# Patient Record
Sex: Female | Born: 1945 | Race: Black or African American | Marital: Married | Smoking: Never smoker
Health system: Southern US, Community
[De-identification: ages and names within clinical notes are randomized; demographics above are authoritative.]

## PROBLEM LIST (undated history)

## (undated) DIAGNOSIS — D219 Benign neoplasm of connective and other soft tissue, unspecified: Secondary | ICD-10-CM

## (undated) DIAGNOSIS — N950 Postmenopausal bleeding: Secondary | ICD-10-CM

## (undated) DIAGNOSIS — I10 Essential (primary) hypertension: Secondary | ICD-10-CM

## (undated) DIAGNOSIS — B3731 Acute candidiasis of vulva and vagina: Secondary | ICD-10-CM

## (undated) DIAGNOSIS — T7840XA Allergy, unspecified, initial encounter: Secondary | ICD-10-CM

## (undated) DIAGNOSIS — N951 Menopausal and female climacteric states: Secondary | ICD-10-CM

## (undated) DIAGNOSIS — N8189 Other female genital prolapse: Secondary | ICD-10-CM

## (undated) DIAGNOSIS — C50912 Malignant neoplasm of unspecified site of left female breast: Secondary | ICD-10-CM

## (undated) DIAGNOSIS — B373 Candidiasis of vulva and vagina: Secondary | ICD-10-CM

## (undated) DIAGNOSIS — E119 Type 2 diabetes mellitus without complications: Secondary | ICD-10-CM

## (undated) HISTORY — DX: Other female genital prolapse: N81.89

## (undated) HISTORY — DX: Allergy, unspecified, initial encounter: T78.40XA

## (undated) HISTORY — DX: Acute candidiasis of vulva and vagina: B37.31

## (undated) HISTORY — DX: Benign neoplasm of connective and other soft tissue, unspecified: D21.9

## (undated) HISTORY — DX: Malignant neoplasm of unspecified site of left female breast: C50.912

## (undated) HISTORY — DX: Candidiasis of vulva and vagina: B37.3

## (undated) HISTORY — DX: Menopausal and female climacteric states: N95.1

## (undated) HISTORY — DX: Type 2 diabetes mellitus without complications: E11.9

## (undated) HISTORY — PX: REDUCTION MAMMAPLASTY: SUR839

## (undated) HISTORY — DX: Postmenopausal bleeding: N95.0

## (undated) HISTORY — DX: Essential (primary) hypertension: I10

---

## 1984-03-23 DIAGNOSIS — C50912 Malignant neoplasm of unspecified site of left female breast: Secondary | ICD-10-CM

## 1984-03-23 HISTORY — DX: Malignant neoplasm of unspecified site of left female breast: C50.912

## 1984-03-23 HISTORY — PX: MASTECTOMY: SHX3

## 1998-03-15 ENCOUNTER — Other Ambulatory Visit: Admission: RE | Admit: 1998-03-15 | Discharge: 1998-03-15 | Payer: Self-pay | Admitting: Obstetrics and Gynecology

## 1999-03-19 ENCOUNTER — Other Ambulatory Visit: Admission: RE | Admit: 1999-03-19 | Discharge: 1999-03-19 | Payer: Self-pay | Admitting: Obstetrics and Gynecology

## 1999-04-30 ENCOUNTER — Encounter: Payer: Self-pay | Admitting: Family Medicine

## 1999-04-30 ENCOUNTER — Encounter: Admission: RE | Admit: 1999-04-30 | Discharge: 1999-04-30 | Payer: Self-pay | Admitting: Family Medicine

## 2000-04-07 ENCOUNTER — Other Ambulatory Visit: Admission: RE | Admit: 2000-04-07 | Discharge: 2000-04-07 | Payer: Self-pay | Admitting: Obstetrics and Gynecology

## 2000-04-07 DIAGNOSIS — N8189 Other female genital prolapse: Secondary | ICD-10-CM | POA: Insufficient documentation

## 2002-04-25 ENCOUNTER — Other Ambulatory Visit: Admission: RE | Admit: 2002-04-25 | Discharge: 2002-04-25 | Payer: Self-pay | Admitting: Obstetrics and Gynecology

## 2003-01-12 ENCOUNTER — Ambulatory Visit (HOSPITAL_COMMUNITY): Admission: RE | Admit: 2003-01-12 | Discharge: 2003-01-12 | Payer: Self-pay | Admitting: Gastroenterology

## 2003-05-07 ENCOUNTER — Other Ambulatory Visit: Admission: RE | Admit: 2003-05-07 | Discharge: 2003-05-07 | Payer: Self-pay | Admitting: Obstetrics and Gynecology

## 2004-05-01 ENCOUNTER — Encounter: Admission: RE | Admit: 2004-05-01 | Discharge: 2004-05-01 | Payer: Self-pay | Admitting: Family Medicine

## 2004-05-13 ENCOUNTER — Other Ambulatory Visit: Admission: RE | Admit: 2004-05-13 | Discharge: 2004-05-13 | Payer: Self-pay | Admitting: Obstetrics and Gynecology

## 2005-05-14 ENCOUNTER — Other Ambulatory Visit: Admission: RE | Admit: 2005-05-14 | Discharge: 2005-05-14 | Payer: Self-pay | Admitting: Obstetrics and Gynecology

## 2005-10-05 ENCOUNTER — Emergency Department (HOSPITAL_COMMUNITY): Admission: EM | Admit: 2005-10-05 | Discharge: 2005-10-05 | Payer: Self-pay | Admitting: Emergency Medicine

## 2010-06-17 DIAGNOSIS — R6882 Decreased libido: Secondary | ICD-10-CM | POA: Insufficient documentation

## 2011-05-13 DIAGNOSIS — R6882 Decreased libido: Secondary | ICD-10-CM

## 2011-05-13 DIAGNOSIS — C50912 Malignant neoplasm of unspecified site of left female breast: Secondary | ICD-10-CM

## 2011-05-13 DIAGNOSIS — N951 Menopausal and female climacteric states: Secondary | ICD-10-CM

## 2011-05-13 DIAGNOSIS — N8189 Other female genital prolapse: Secondary | ICD-10-CM

## 2011-06-09 DIAGNOSIS — Z1231 Encounter for screening mammogram for malignant neoplasm of breast: Secondary | ICD-10-CM | POA: Diagnosis not present

## 2011-06-23 ENCOUNTER — Ambulatory Visit: Payer: Self-pay | Admitting: Obstetrics and Gynecology

## 2011-08-04 ENCOUNTER — Ambulatory Visit (INDEPENDENT_AMBULATORY_CARE_PROVIDER_SITE_OTHER): Payer: Medicare Other | Admitting: Obstetrics and Gynecology

## 2011-08-04 ENCOUNTER — Encounter: Payer: Self-pay | Admitting: Obstetrics and Gynecology

## 2011-08-04 VITALS — BP 130/78 | Resp 16 | Ht 63.0 in | Wt 179.0 lb

## 2011-08-04 DIAGNOSIS — Z01419 Encounter for gynecological examination (general) (routine) without abnormal findings: Secondary | ICD-10-CM

## 2011-08-04 DIAGNOSIS — E669 Obesity, unspecified: Secondary | ICD-10-CM

## 2011-08-04 DIAGNOSIS — Z124 Encounter for screening for malignant neoplasm of cervix: Secondary | ICD-10-CM | POA: Diagnosis not present

## 2011-08-04 DIAGNOSIS — Z9189 Other specified personal risk factors, not elsewhere classified: Secondary | ICD-10-CM

## 2011-08-04 DIAGNOSIS — Z853 Personal history of malignant neoplasm of breast: Secondary | ICD-10-CM

## 2011-08-04 NOTE — Progress Notes (Signed)
Regular Periods: no Mammogram: yes  Monthly Breast Ex.: yes Exercise: yes  Tetanus < 10 years: yes Seatbelts: yes  NI. Bladder Functn.: yes Abuse at home: no  Daily BM's: yes Stressful Work: no  Healthy Diet: yes Sigmoid-Colonoscopy: 2005 "WNL"  Calcium: no Medical problems this year: per pt none   LAST PAP:05/2010 "WNL"  Contraception: per pt none  Mammogram:  05/2011 "WNL"   PCP: Dr. Earl Gala  PMH: No Changes  Irreg Periods: no Mood Swings: no Hot Flashes: yes Vaginal Dryness: no Poor Sleeping: yes Urinary Urgency: no UTI Symptoms: no HRT: no Fam Hx Osteo: no Prior Bone Scan: Yes 2003 Osteoporosis: No Hx of Dvt: No  FMH: No Changes  Last Bone Scan: " 2003"  Was last bone scan .pt is schedule for bone scan next month. 09-01-2011   Subjective:    JAMIRACLE AVANTS is a 66 y.o. female No obstetric history on file. who presents for annual exam. The patient has no complaints today. The patient has a history of breast cancer from 20 years ago.  She is currently without evidence of disease.  She continues to have some difficulty sleeping.  The following portions of the patient's history were reviewed and updated as appropriate: allergies, current medications, past family history, past medical history, past social history, past surgical history and problem list. See above.  Review of Systems Pertinent items are noted in HPI. Gastrointestinal:No change in bowel habits, no abdominal pain, no rectal bleeding Genitourinary:negative for dysuria, frequency, hematuria, nocturia and urinary incontinence    Objective:     BP 130/78  Resp 16  Ht 5\' 3"  (1.6 m)  Wt 179 lb (81.194 kg)  BMI 31.71 kg/m2  Weight:  Wt Readings from Last 1 Encounters:  08/04/11 179 lb (81.194 kg)     BMI: Body mass index is 31.71 kg/(m^2). General Appearance: Alert, appropriate appearance for age. No acute distress HEENT: Grossly normal Neck / Thyroid: Supple, no masses, nodes or enlargement Lungs:  clear to auscultation bilaterally Back: No CVA tenderness Breast Exam: scars from her mastectomy on the left and No masses or nodes.No dimpling, nipple retraction or discharge. Cardiovascular: Regular rate and rhythm. S1, S2, no murmur Gastrointestinal: Soft, non-tender, no masses or organomegaly  ++++++++++++++++++++++++++++++++++++++++++++++++++++++++  Pelvic Exam: External genitalia: normal general appearance Vaginal: normal without tenderness, induration or masses, atrophic mucosa and relaxation noted Cervix: normal appearance Adnexa: normal bimanual exam Uterus: normal size shape and consistency Rectovaginal: normal rectal, no masses  ++++++++++++++++++++++++++++++++++++++++++++++++++++++++  Lymphatic Exam: Non-palpable nodes in neck, clavicular, axillary, or inguinal regions  Psychiatric: Alert and oriented, appropriate affect.    Urinalysis:Not done      Assessment:    Normal gyn exam   Overweight or obese: Yes  Pelvic relaxation: Yes  Menopausal symptoms: Yes. Severe: No.   Plan:    Pap smear.   Follow-up:  for annual exam  STD screen request: none,  RPR: No,  HBsAg: No.  Hepatitis C: No.  The updated Pap smear screening guidelines were discussed with the patient. The patient requested that I obtain a Pap smear: Yes.  Kegel exercises discussed: Yes.  Proper diet and regular exercise were reviewed.  Annual mammograms recommended starting at age 63. Proper breast care was discussed.  Screening colonoscopy is recommended beginning at age 54.  Regular health maintenance was reviewed.  Sleep hygiene was discussed.  Adequate calcium and vitamin D intake was emphasized. The patient says that calcium does not agree with her.  Ana Coffey.D.

## 2011-08-07 LAB — PAP IG W/ RFLX HPV ASCU

## 2011-09-01 DIAGNOSIS — I10 Essential (primary) hypertension: Secondary | ICD-10-CM | POA: Diagnosis not present

## 2011-09-01 DIAGNOSIS — Z13820 Encounter for screening for osteoporosis: Secondary | ICD-10-CM | POA: Diagnosis not present

## 2011-09-22 DIAGNOSIS — M545 Low back pain: Secondary | ICD-10-CM | POA: Diagnosis not present

## 2012-03-01 DIAGNOSIS — I10 Essential (primary) hypertension: Secondary | ICD-10-CM | POA: Diagnosis not present

## 2012-03-01 DIAGNOSIS — Z1331 Encounter for screening for depression: Secondary | ICD-10-CM | POA: Diagnosis not present

## 2012-03-01 DIAGNOSIS — Z0000 Encounter for general adult medical examination without abnormal findings: Secondary | ICD-10-CM | POA: Diagnosis not present

## 2012-03-01 DIAGNOSIS — E7800 Pure hypercholesterolemia, unspecified: Secondary | ICD-10-CM | POA: Diagnosis not present

## 2012-04-01 DIAGNOSIS — L708 Other acne: Secondary | ICD-10-CM | POA: Diagnosis not present

## 2012-06-10 DIAGNOSIS — Z1231 Encounter for screening mammogram for malignant neoplasm of breast: Secondary | ICD-10-CM | POA: Diagnosis not present

## 2012-09-06 DIAGNOSIS — I10 Essential (primary) hypertension: Secondary | ICD-10-CM | POA: Diagnosis not present

## 2013-03-02 DIAGNOSIS — C50919 Malignant neoplasm of unspecified site of unspecified female breast: Secondary | ICD-10-CM | POA: Diagnosis not present

## 2013-03-02 DIAGNOSIS — Z124 Encounter for screening for malignant neoplasm of cervix: Secondary | ICD-10-CM | POA: Diagnosis not present

## 2013-03-02 DIAGNOSIS — Z01419 Encounter for gynecological examination (general) (routine) without abnormal findings: Secondary | ICD-10-CM | POA: Diagnosis not present

## 2013-03-02 DIAGNOSIS — F520 Hypoactive sexual desire disorder: Secondary | ICD-10-CM | POA: Diagnosis not present

## 2013-03-09 DIAGNOSIS — Z1331 Encounter for screening for depression: Secondary | ICD-10-CM | POA: Diagnosis not present

## 2013-03-09 DIAGNOSIS — E7800 Pure hypercholesterolemia, unspecified: Secondary | ICD-10-CM | POA: Diagnosis not present

## 2013-03-09 DIAGNOSIS — Z23 Encounter for immunization: Secondary | ICD-10-CM | POA: Diagnosis not present

## 2013-03-09 DIAGNOSIS — I10 Essential (primary) hypertension: Secondary | ICD-10-CM | POA: Diagnosis not present

## 2013-03-09 DIAGNOSIS — Z0000 Encounter for general adult medical examination without abnormal findings: Secondary | ICD-10-CM | POA: Diagnosis not present

## 2013-03-09 DIAGNOSIS — C50919 Malignant neoplasm of unspecified site of unspecified female breast: Secondary | ICD-10-CM | POA: Diagnosis not present

## 2013-06-09 DIAGNOSIS — D126 Benign neoplasm of colon, unspecified: Secondary | ICD-10-CM | POA: Diagnosis not present

## 2013-06-09 DIAGNOSIS — K5730 Diverticulosis of large intestine without perforation or abscess without bleeding: Secondary | ICD-10-CM | POA: Diagnosis not present

## 2013-06-09 DIAGNOSIS — Z1211 Encounter for screening for malignant neoplasm of colon: Secondary | ICD-10-CM | POA: Diagnosis not present

## 2013-06-09 DIAGNOSIS — K648 Other hemorrhoids: Secondary | ICD-10-CM | POA: Diagnosis not present

## 2013-06-09 DIAGNOSIS — K620 Anal polyp: Secondary | ICD-10-CM | POA: Diagnosis not present

## 2013-06-16 DIAGNOSIS — Z1231 Encounter for screening mammogram for malignant neoplasm of breast: Secondary | ICD-10-CM | POA: Diagnosis not present

## 2013-06-16 DIAGNOSIS — Z853 Personal history of malignant neoplasm of breast: Secondary | ICD-10-CM | POA: Diagnosis not present

## 2013-07-06 DIAGNOSIS — J309 Allergic rhinitis, unspecified: Secondary | ICD-10-CM | POA: Diagnosis not present

## 2013-09-07 DIAGNOSIS — I10 Essential (primary) hypertension: Secondary | ICD-10-CM | POA: Diagnosis not present

## 2013-09-07 DIAGNOSIS — M79609 Pain in unspecified limb: Secondary | ICD-10-CM | POA: Diagnosis not present

## 2014-03-13 DIAGNOSIS — L700 Acne vulgaris: Secondary | ICD-10-CM | POA: Diagnosis not present

## 2014-03-13 DIAGNOSIS — Z0000 Encounter for general adult medical examination without abnormal findings: Secondary | ICD-10-CM | POA: Diagnosis not present

## 2014-03-13 DIAGNOSIS — Z1389 Encounter for screening for other disorder: Secondary | ICD-10-CM | POA: Diagnosis not present

## 2014-03-13 DIAGNOSIS — Z23 Encounter for immunization: Secondary | ICD-10-CM | POA: Diagnosis not present

## 2014-03-13 DIAGNOSIS — I10 Essential (primary) hypertension: Secondary | ICD-10-CM | POA: Diagnosis not present

## 2014-03-13 DIAGNOSIS — E780 Pure hypercholesterolemia: Secondary | ICD-10-CM | POA: Diagnosis not present

## 2014-06-21 DIAGNOSIS — Z1231 Encounter for screening mammogram for malignant neoplasm of breast: Secondary | ICD-10-CM | POA: Diagnosis not present

## 2014-06-21 DIAGNOSIS — Z853 Personal history of malignant neoplasm of breast: Secondary | ICD-10-CM | POA: Diagnosis not present

## 2014-09-10 DIAGNOSIS — L680 Hirsutism: Secondary | ICD-10-CM | POA: Insufficient documentation

## 2014-09-10 DIAGNOSIS — L700 Acne vulgaris: Secondary | ICD-10-CM | POA: Diagnosis not present

## 2014-09-13 DIAGNOSIS — E780 Pure hypercholesterolemia: Secondary | ICD-10-CM | POA: Diagnosis not present

## 2014-09-13 DIAGNOSIS — L709 Acne, unspecified: Secondary | ICD-10-CM | POA: Diagnosis not present

## 2014-09-13 DIAGNOSIS — I10 Essential (primary) hypertension: Secondary | ICD-10-CM | POA: Diagnosis not present

## 2014-09-13 DIAGNOSIS — C50912 Malignant neoplasm of unspecified site of left female breast: Secondary | ICD-10-CM | POA: Diagnosis not present

## 2014-09-13 DIAGNOSIS — R232 Flushing: Secondary | ICD-10-CM | POA: Diagnosis not present

## 2015-03-26 DIAGNOSIS — N951 Menopausal and female climacteric states: Secondary | ICD-10-CM | POA: Diagnosis not present

## 2015-03-26 DIAGNOSIS — C50919 Malignant neoplasm of unspecified site of unspecified female breast: Secondary | ICD-10-CM | POA: Diagnosis not present

## 2015-03-26 DIAGNOSIS — Z124 Encounter for screening for malignant neoplasm of cervix: Secondary | ICD-10-CM | POA: Diagnosis not present

## 2015-03-26 DIAGNOSIS — Z01411 Encounter for gynecological examination (general) (routine) with abnormal findings: Secondary | ICD-10-CM | POA: Diagnosis not present

## 2015-03-26 DIAGNOSIS — Z6832 Body mass index (BMI) 32.0-32.9, adult: Secondary | ICD-10-CM | POA: Diagnosis not present

## 2015-03-26 DIAGNOSIS — Z01419 Encounter for gynecological examination (general) (routine) without abnormal findings: Secondary | ICD-10-CM | POA: Insufficient documentation

## 2015-04-15 DIAGNOSIS — C50912 Malignant neoplasm of unspecified site of left female breast: Secondary | ICD-10-CM | POA: Diagnosis not present

## 2015-04-15 DIAGNOSIS — Z1389 Encounter for screening for other disorder: Secondary | ICD-10-CM | POA: Diagnosis not present

## 2015-04-15 DIAGNOSIS — E7800 Pure hypercholesterolemia, unspecified: Secondary | ICD-10-CM | POA: Diagnosis not present

## 2015-04-15 DIAGNOSIS — R7309 Other abnormal glucose: Secondary | ICD-10-CM | POA: Diagnosis not present

## 2015-04-15 DIAGNOSIS — I10 Essential (primary) hypertension: Secondary | ICD-10-CM | POA: Diagnosis not present

## 2015-04-15 DIAGNOSIS — J069 Acute upper respiratory infection, unspecified: Secondary | ICD-10-CM | POA: Diagnosis not present

## 2015-04-15 DIAGNOSIS — Z780 Asymptomatic menopausal state: Secondary | ICD-10-CM | POA: Diagnosis not present

## 2015-04-15 DIAGNOSIS — R232 Flushing: Secondary | ICD-10-CM | POA: Diagnosis not present

## 2015-04-15 DIAGNOSIS — Z0000 Encounter for general adult medical examination without abnormal findings: Secondary | ICD-10-CM | POA: Diagnosis not present

## 2015-05-03 DIAGNOSIS — E119 Type 2 diabetes mellitus without complications: Secondary | ICD-10-CM | POA: Diagnosis not present

## 2015-05-03 DIAGNOSIS — Z7984 Long term (current) use of oral hypoglycemic drugs: Secondary | ICD-10-CM | POA: Diagnosis not present

## 2015-06-04 ENCOUNTER — Encounter: Payer: Self-pay | Admitting: *Deleted

## 2015-06-04 ENCOUNTER — Encounter: Payer: Medicare Other | Attending: Internal Medicine | Admitting: *Deleted

## 2015-06-04 VITALS — Ht 63.5 in | Wt 179.1 lb

## 2015-06-04 DIAGNOSIS — E119 Type 2 diabetes mellitus without complications: Secondary | ICD-10-CM

## 2015-06-04 NOTE — Patient Instructions (Addendum)
Plan:  Aim for 2-3 Carb Choices per meal (30-45 grams) +/- 1 either way  Aim for 0-15 Carbs per snack if hungry  Include protein in moderation with your meals and snacks Consider reading food labels for Total Carbohydrate and Fat Grams of foods Consider  increasing your activity level by exxercise for 30 minutes daily as tolerated Continue taking medication as directed by MD - Continue with your balanced meals including: protein, vegetable and carbohydrate Oatmeal + oiled egg = a win! Lunch: Salad with no fruit + smoothie = Win!  Start walking or exercising to a goal of 30 minutes 5 times per week. Make it a gradual process.  Baby Steps  You are doing a fabulous job with your food choices  Keep up the good work!

## 2015-06-04 NOTE — Progress Notes (Signed)
Diabetes Self-Management Education  Visit Type: First/Initial  Appt. Start Time: 1400 Appt. End Time: [id number]  06/04/2015  Ms. Ana Coffey, identified by name and date of birth, is a 70 y.o. female with a diagnosis of Diabetes: Type 2. Ana Coffey is married, both she and her husband are retired. She remains active with her church. She has "Silver Sneakers", home fitness equipments none of which she is actively utilizing. She does do some walking.  ASSESSMENT  Height 5' 3.5" (1.613 m), weight 179 lb 1.6 oz (81.239 kg). Body mass index is 31.22 kg/(m^2).      Diabetes Self-Management Education - 06/04/15 1427    Visit Information   Visit Type First/Initial   Initial Visit   Diabetes Type Type 2   Are you currently following a meal plan? No   Are you taking your medications as prescribed? Yes   Date Diagnosed 04/2015   Health Coping   How would you rate your overall health? Good   Psychosocial Assessment   Patient Belief/Attitude about Diabetes Motivated to manage diabetes   Self-care barriers None   Self-management support Doctor's office;CDE visits;Family   Other persons present Patient   Patient Concerns Nutrition/Meal planning;Healthy Lifestyle   Special Needs None   Preferred Learning Style No preference indicated   Learning Readiness Change in progress   How often do you need to have someone help you when you read instructions, pamphlets, or other written materials from your doctor or pharmacy? 2 - Rarely   What is the last grade level you completed in school? 2 yrs college   Complications   Last HgB A1C per patient/outside source 7.1 %   How often do you check your blood sugar? Not recommended by provider   Have you had a dilated eye exam in the past 12 months? No   Have you had a dental exam in the past 12 months? No   Are you checking your feet? No   Dietary Intake   Breakfast egg omlet, mixed fruit, vegetables   Lunch salad with chicken/salmon, fruit, nuts / [place]  dressing / raspberry vinegarette   /  vanilla greek yogurt, frozen fruit, coconut [place] milk, stevia   Dinner protein, vegetables, brown rice, red potatoes   Snack (evening) Caramel / white chedder popcorn / almonds, cashews   Beverage(s) water/flavored, coffee with flavored creamer, stevia   Exercise   Exercise Type ADL's   Patient Education   Previous Diabetes Education No   Disease state  Definition of diabetes, type 1 and 2, and the diagnosis of diabetes;Factors that contribute to the development of diabetes   Nutrition management  Role of diet in the treatment of diabetes and the relationship between the three main macronutrients and blood glucose level;Food label reading, portion sizes and measuring food.;Carbohydrate counting;Meal options for control of blood glucose level and chronic complications.   Physical activity and exercise  Role of exercise on diabetes management, blood pressure control and cardiac health.   Medications Reviewed patients medication for diabetes, action, purpose, timing of dose and side effects.   Monitoring Identified appropriate SMBG and/or A1C goals.   Chronic complications Relationship between chronic complications and blood glucose control;Retinopathy and reason for yearly dilated eye exams   Psychosocial adjustment Role of stress on diabetes   Individualized Goals (developed by patient)   Nutrition General guidelines for healthy choices and portions discussed   Physical Activity --  working on getting started   Medications take my medication as prescribed   Reducing  Risk increase portions of olive oil in diet;increase portions of nuts and seeds   Outcomes   Expected Outcomes Demonstrated interest in learning. Expect positive outcomes   Future DMSE PRN   Program Status Completed      Individualized Plan for Diabetes Self-Management Training:   Learning Objective:  Patient will have a greater understanding of diabetes self-management. Patient  education plan is to attend individual and/or group sessions per assessed needs and concerns.   Plan:   Patient Instructions  Plan:  Aim for 2-3 Carb Choices per meal (30-45 grams) +/- 1 either way  Aim for 0-15 Carbs per snack if hungry  Include protein in moderation with your meals and snacks Consider reading food labels for Total Carbohydrate and Fat Grams of foods Consider  increasing your activity level by exxercise for 30 minutes daily as tolerated Continue taking medication as directed by MD - Continue with your balanced meals including: protein, vegetable and carbohydrate Oatmeal + oiled egg = a win! Lunch: Salad with no fruit + smoothie = Win!  Start walking or exercising to a goal of 30 minutes 5 times per week. Make it a gradual process.  Baby Steps  You are doing a fabulous job with your food choices  Keep up the good work!  Expected Outcomes:  Demonstrated interest in learning. Expect positive outcomes  Education material provided: Living Well with Diabetes, A1C conversion sheet and Meal plan card  If problems or questions, patient to contact team via:  Phone  Future DSME appointment: PRN

## 2015-06-28 DIAGNOSIS — Z853 Personal history of malignant neoplasm of breast: Secondary | ICD-10-CM | POA: Diagnosis not present

## 2015-06-28 DIAGNOSIS — Z1231 Encounter for screening mammogram for malignant neoplasm of breast: Secondary | ICD-10-CM | POA: Diagnosis not present

## 2015-08-01 DIAGNOSIS — Z780 Asymptomatic menopausal state: Secondary | ICD-10-CM | POA: Diagnosis not present

## 2015-11-06 DIAGNOSIS — I10 Essential (primary) hypertension: Secondary | ICD-10-CM | POA: Diagnosis not present

## 2015-11-06 DIAGNOSIS — Z1159 Encounter for screening for other viral diseases: Secondary | ICD-10-CM | POA: Diagnosis not present

## 2015-11-06 DIAGNOSIS — C50912 Malignant neoplasm of unspecified site of left female breast: Secondary | ICD-10-CM | POA: Diagnosis not present

## 2015-11-06 DIAGNOSIS — E119 Type 2 diabetes mellitus without complications: Secondary | ICD-10-CM | POA: Diagnosis not present

## 2016-02-18 DIAGNOSIS — Z7984 Long term (current) use of oral hypoglycemic drugs: Secondary | ICD-10-CM | POA: Diagnosis not present

## 2016-02-18 DIAGNOSIS — E113291 Type 2 diabetes mellitus with mild nonproliferative diabetic retinopathy without macular edema, right eye: Secondary | ICD-10-CM | POA: Diagnosis not present

## 2016-02-18 DIAGNOSIS — I10 Essential (primary) hypertension: Secondary | ICD-10-CM | POA: Diagnosis not present

## 2016-02-18 DIAGNOSIS — H35032 Hypertensive retinopathy, left eye: Secondary | ICD-10-CM | POA: Diagnosis not present

## 2016-02-20 DIAGNOSIS — Z23 Encounter for immunization: Secondary | ICD-10-CM | POA: Diagnosis not present

## 2016-03-31 DIAGNOSIS — I10 Essential (primary) hypertension: Secondary | ICD-10-CM | POA: Diagnosis not present

## 2016-03-31 DIAGNOSIS — M545 Low back pain: Secondary | ICD-10-CM | POA: Diagnosis not present

## 2016-03-31 DIAGNOSIS — Z0000 Encounter for general adult medical examination without abnormal findings: Secondary | ICD-10-CM | POA: Diagnosis not present

## 2016-03-31 DIAGNOSIS — C50912 Malignant neoplasm of unspecified site of left female breast: Secondary | ICD-10-CM | POA: Diagnosis not present

## 2016-03-31 DIAGNOSIS — E7800 Pure hypercholesterolemia, unspecified: Secondary | ICD-10-CM | POA: Diagnosis not present

## 2016-03-31 DIAGNOSIS — Z1389 Encounter for screening for other disorder: Secondary | ICD-10-CM | POA: Diagnosis not present

## 2016-03-31 DIAGNOSIS — Z7984 Long term (current) use of oral hypoglycemic drugs: Secondary | ICD-10-CM | POA: Diagnosis not present

## 2016-03-31 DIAGNOSIS — Z1239 Encounter for other screening for malignant neoplasm of breast: Secondary | ICD-10-CM | POA: Diagnosis not present

## 2016-03-31 DIAGNOSIS — E119 Type 2 diabetes mellitus without complications: Secondary | ICD-10-CM | POA: Diagnosis not present

## 2016-04-16 DIAGNOSIS — J069 Acute upper respiratory infection, unspecified: Secondary | ICD-10-CM | POA: Diagnosis not present

## 2016-07-03 DIAGNOSIS — Z1231 Encounter for screening mammogram for malignant neoplasm of breast: Secondary | ICD-10-CM | POA: Diagnosis not present

## 2016-09-28 DIAGNOSIS — Z1389 Encounter for screening for other disorder: Secondary | ICD-10-CM | POA: Diagnosis not present

## 2016-09-28 DIAGNOSIS — I10 Essential (primary) hypertension: Secondary | ICD-10-CM | POA: Diagnosis not present

## 2016-09-28 DIAGNOSIS — C50912 Malignant neoplasm of unspecified site of left female breast: Secondary | ICD-10-CM | POA: Diagnosis not present

## 2016-09-28 DIAGNOSIS — E7800 Pure hypercholesterolemia, unspecified: Secondary | ICD-10-CM | POA: Diagnosis not present

## 2016-09-28 DIAGNOSIS — E119 Type 2 diabetes mellitus without complications: Secondary | ICD-10-CM | POA: Diagnosis not present

## 2017-02-19 DIAGNOSIS — Z23 Encounter for immunization: Secondary | ICD-10-CM | POA: Diagnosis not present

## 2017-03-23 DIAGNOSIS — Z923 Personal history of irradiation: Secondary | ICD-10-CM

## 2017-03-23 HISTORY — DX: Personal history of irradiation: Z92.3

## 2017-03-31 DIAGNOSIS — Z23 Encounter for immunization: Secondary | ICD-10-CM | POA: Diagnosis not present

## 2017-03-31 DIAGNOSIS — C50912 Malignant neoplasm of unspecified site of left female breast: Secondary | ICD-10-CM | POA: Diagnosis not present

## 2017-03-31 DIAGNOSIS — E7800 Pure hypercholesterolemia, unspecified: Secondary | ICD-10-CM | POA: Diagnosis not present

## 2017-03-31 DIAGNOSIS — Z0000 Encounter for general adult medical examination without abnormal findings: Secondary | ICD-10-CM | POA: Diagnosis not present

## 2017-03-31 DIAGNOSIS — I10 Essential (primary) hypertension: Secondary | ICD-10-CM | POA: Diagnosis not present

## 2017-03-31 DIAGNOSIS — E119 Type 2 diabetes mellitus without complications: Secondary | ICD-10-CM | POA: Diagnosis not present

## 2017-03-31 DIAGNOSIS — Z1389 Encounter for screening for other disorder: Secondary | ICD-10-CM | POA: Diagnosis not present

## 2017-04-02 DIAGNOSIS — Z1231 Encounter for screening mammogram for malignant neoplasm of breast: Secondary | ICD-10-CM | POA: Diagnosis not present

## 2017-04-02 DIAGNOSIS — Z124 Encounter for screening for malignant neoplasm of cervix: Secondary | ICD-10-CM | POA: Diagnosis not present

## 2017-04-02 DIAGNOSIS — Z01419 Encounter for gynecological examination (general) (routine) without abnormal findings: Secondary | ICD-10-CM | POA: Diagnosis not present

## 2017-07-16 DIAGNOSIS — Z1231 Encounter for screening mammogram for malignant neoplasm of breast: Secondary | ICD-10-CM | POA: Diagnosis not present

## 2017-07-16 DIAGNOSIS — Z853 Personal history of malignant neoplasm of breast: Secondary | ICD-10-CM | POA: Diagnosis not present

## 2017-07-21 HISTORY — PX: BREAST LUMPECTOMY: SHX2

## 2017-07-23 DIAGNOSIS — N6313 Unspecified lump in the right breast, lower outer quadrant: Secondary | ICD-10-CM | POA: Diagnosis not present

## 2017-07-23 DIAGNOSIS — N6311 Unspecified lump in the right breast, upper outer quadrant: Secondary | ICD-10-CM | POA: Diagnosis not present

## 2017-07-27 ENCOUNTER — Other Ambulatory Visit: Payer: Self-pay | Admitting: Radiology

## 2017-07-27 DIAGNOSIS — N6311 Unspecified lump in the right breast, upper outer quadrant: Secondary | ICD-10-CM | POA: Diagnosis not present

## 2017-07-27 DIAGNOSIS — C50811 Malignant neoplasm of overlapping sites of right female breast: Secondary | ICD-10-CM | POA: Diagnosis not present

## 2017-07-27 DIAGNOSIS — N6313 Unspecified lump in the right breast, lower outer quadrant: Secondary | ICD-10-CM | POA: Diagnosis not present

## 2017-07-27 DIAGNOSIS — N6031 Fibrosclerosis of right breast: Secondary | ICD-10-CM | POA: Diagnosis not present

## 2017-07-29 ENCOUNTER — Telehealth: Payer: Self-pay | Admitting: Hematology and Oncology

## 2017-07-29 NOTE — Telephone Encounter (Signed)
Spoke to patient to confirm afternoon [facility] appointment, [facility] patient, appointment reminder sent

## 2017-07-30 ENCOUNTER — Other Ambulatory Visit: Payer: Self-pay | Admitting: *Deleted

## 2017-07-30 DIAGNOSIS — C50411 Malignant neoplasm of upper-outer quadrant of right female breast: Secondary | ICD-10-CM | POA: Insufficient documentation

## 2017-07-30 DIAGNOSIS — Z170 Estrogen receptor positive status [ER+]: Principal | ICD-10-CM

## 2017-08-01 ENCOUNTER — Encounter: Payer: Self-pay | Admitting: General Surgery

## 2017-08-03 DIAGNOSIS — R21 Rash and other nonspecific skin eruption: Secondary | ICD-10-CM | POA: Diagnosis not present

## 2017-08-04 ENCOUNTER — Ambulatory Visit: Payer: Medicare Other | Attending: General Surgery | Admitting: Physical Therapy

## 2017-08-04 ENCOUNTER — Other Ambulatory Visit: Payer: Self-pay | Admitting: General Surgery

## 2017-08-04 ENCOUNTER — Ambulatory Visit
Admission: RE | Admit: 2017-08-04 | Discharge: 2017-08-04 | Disposition: A | Discharge status: Home / Self Care | Payer: Medicare Other | Source: Ambulatory Visit | Attending: Radiation Oncology | Admitting: Radiation Oncology

## 2017-08-04 ENCOUNTER — Inpatient Hospital Stay: Payer: Medicare Other | Attending: Hematology and Oncology | Admitting: Hematology and Oncology

## 2017-08-04 ENCOUNTER — Encounter: Payer: Self-pay | Admitting: Physical Therapy

## 2017-08-04 ENCOUNTER — Other Ambulatory Visit: Payer: Self-pay

## 2017-08-04 ENCOUNTER — Inpatient Hospital Stay: Payer: Medicare Other

## 2017-08-04 ENCOUNTER — Encounter: Payer: Self-pay | Admitting: Hematology and Oncology

## 2017-08-04 DIAGNOSIS — C50411 Malignant neoplasm of upper-outer quadrant of right female breast: Secondary | ICD-10-CM | POA: Diagnosis not present

## 2017-08-04 DIAGNOSIS — Z803 Family history of malignant neoplasm of breast: Secondary | ICD-10-CM | POA: Diagnosis not present

## 2017-08-04 DIAGNOSIS — E119 Type 2 diabetes mellitus without complications: Secondary | ICD-10-CM | POA: Insufficient documentation

## 2017-08-04 DIAGNOSIS — Z170 Estrogen receptor positive status [ER+]: Secondary | ICD-10-CM | POA: Diagnosis not present

## 2017-08-04 DIAGNOSIS — Z789 Other specified health status: Secondary | ICD-10-CM | POA: Diagnosis not present

## 2017-08-04 DIAGNOSIS — R293 Abnormal posture: Secondary | ICD-10-CM

## 2017-08-04 DIAGNOSIS — Z9012 Acquired absence of left breast and nipple: Secondary | ICD-10-CM | POA: Diagnosis not present

## 2017-08-04 DIAGNOSIS — Z853 Personal history of malignant neoplasm of breast: Secondary | ICD-10-CM | POA: Diagnosis not present

## 2017-08-04 DIAGNOSIS — Z9889 Other specified postprocedural states: Secondary | ICD-10-CM | POA: Diagnosis not present

## 2017-08-04 DIAGNOSIS — I10 Essential (primary) hypertension: Secondary | ICD-10-CM | POA: Diagnosis not present

## 2017-08-04 DIAGNOSIS — Z79899 Other long term (current) drug therapy: Secondary | ICD-10-CM | POA: Insufficient documentation

## 2017-08-04 LAB — CMP (CANCER CENTER ONLY)
ALBUMIN: 4.1 g/dL (ref 3.5–5.0)
ALK PHOS: 70 U/L (ref 40–150)
ALT: 25 U/L (ref 0–55)
AST: 25 U/L (ref 5–34)
Anion gap: 7 (ref 3–11)
BILIRUBIN TOTAL: 0.4 mg/dL (ref 0.2–1.2)
BUN: 16 mg/dL (ref 7–26)
CALCIUM: 9.6 mg/dL (ref 8.4–10.4)
CO2: 30 mmol/L — ABNORMAL HIGH (ref 22–29)
CREATININE: 0.94 mg/dL (ref 0.60–1.10)
Chloride: 104 mmol/L (ref 98–109)
GFR, EST NON AFRICAN AMERICAN: 59 mL/min — AB (ref >60)
GFR, Est AFR Am: >60 mL/min (ref >60)
GLUCOSE: 109 mg/dL (ref 70–140)
Potassium: 4 mmol/L (ref 3.5–5.1)
Sodium: 141 mmol/L (ref 136–145)
TOTAL PROTEIN: 7 g/dL (ref 6.4–8.3)

## 2017-08-04 LAB — CBC WITH DIFFERENTIAL (CANCER CENTER ONLY)
BASOS PCT: 2 %
Basophils Absolute: 0.1 10*3/uL (ref 0.0–0.1)
Eosinophils Absolute: 0.1 10*3/uL (ref 0.0–0.5)
Eosinophils Relative: 4 %
HEMATOCRIT: 42.9 % (ref 34.8–46.6)
HEMOGLOBIN: 14.1 g/dL (ref 11.6–15.9)
Lymphocytes Relative: 29 %
Lymphs Abs: 1 10*3/uL (ref 0.9–3.3)
MCH: 28.7 pg (ref 25.1–34.0)
MCHC: 32.8 g/dL (ref 31.5–36.0)
MCV: 87.4 fL (ref 79.5–101.0)
Monocytes Absolute: 0.3 10*3/uL (ref 0.1–0.9)
Monocytes Relative: 10 %
NEUTROS PCT: 55 %
Neutro Abs: 2 10*3/uL (ref 1.5–6.5)
Platelet Count: 195 10*3/uL (ref 145–400)
RBC: 4.91 MIL/uL (ref 3.70–5.45)
RDW: 13.8 % (ref 11.2–14.5)
WBC: 3.5 10*3/uL — AB (ref 3.9–10.3)

## 2017-08-04 NOTE — Progress Notes (Signed)
Ana Coffey NOTE  Patient Care Team: Marius Ditch, MD as PCP - General (Internal Medicine) Fanny Skates, MD as Consulting Physician (General Surgery) Nicholas Lose, MD as Consulting Physician (Hematology and Oncology) Eppie Gibson, MD as Attending Physician (Radiation Oncology)  CHIEF COMPLAINTS/PURPOSE OF CONSULTATION:  Newly diagnosed breast cancer  HISTORY OF PRESENTING ILLNESS:  Ana Coffey 72 y.o. female is here because of recent diagnosis of right breast cancer.  Patient had a screening mammogram that detected a asymmetry in the left breast 0.4 cm at 9 o'clock position 8 cm from nipple.  Biopsy revealed a fragmented specimen with DIC as with probable invasive carcinoma grade 2 that was ER PR positive HER-2 negative with a Ki-67 60%.  She was presented this morning in the multidisciplinary tumor board and she is here today accompanied by her family to discuss her treatment plan.   She had a prior history of left breast cancer treated with mastectomy and reconstruction followed by she is in excellent physical health.  Adjuvant chemotherapy and antiestrogen therapy with tamoxifen for 2 to 3 years.   I reviewed her records extensively and collaborated the history with the patient.  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 Allergy   . Breast cancer, left breast ([place])   . Candida vaginitis   . Diabetes mellitus without complication ([place])   . Fibroids   . Hypertension   . Menopausal symptoms   . Pelvic relaxation   . Postmenopausal bleeding     SURGICAL HISTORY: Past Surgical History:  Procedure Laterality Date  . MASTECTOMY  1986   left breast    SOCIAL HISTORY: Social History   Socioeconomic History  . Marital status: Married    Spouse name: Not on file  . Number of children: Not on file  . Years of education: Not on file  . Highest education level: Not on file  Occupational History  . Not on file  Social Needs  . Financial resource strain: Not on file  . Food insecurity:    Worry: Not on file    Inability: Not on file  . Transportation needs:    Medical: Not on file    Non-medical: Not on file  Tobacco Use  . Smoking status: Never Smoker  . Smokeless tobacco: Never Used  Substance and Sexual Activity  . Alcohol use: Yes  . Drug use: No  . Sexual activity: Not on file  Lifestyle  . Physical activity:    Days per week: Not on file    Minutes per session: Not on file  . Stress: Not on file  Relationships  . Social connections:    Talks on phone: Not on file    Gets together: Not on file  Attends religious service: Not on file    Active member of club or organization: Not on file    Attends meetings of clubs or organizations: Not on file    Relationship status: Not on file  . Intimate partner violence:    Fear of current or ex partner: Not on file    Emotionally abused: Not on file    Physically abused: Not on file    Forced sexual activity: Not on file  Other Topics Concern  . Not on file  Social History Narrative  . Not on file    FAMILY HISTORY: Family History  Problem Relation Age of Onset  . Cancer Mother   . Breast cancer Mother     ALLERGIES:  has No Known Allergies.  MEDICATIONS:   Current Outpatient Medications  Medication Sig Dispense Refill  . Cholecalciferol (VITAMIN D3) 1000 units CAPS Take by mouth daily.    . Cinnamon 500 MG TABS Take by mouth.    . fluticasone (FLONASE) 50 MCG/ACT nasal spray Place 2 sprays into both nostrils daily.    . hydrochlorothiazide (HYDRODIURIL) 25 MG tablet Take 25 mg by mouth daily.    Marland Kitchen ketoconazole (NIZORAL) 2 % cream     . metFORMIN (GLUCOPHAGE) 500 MG tablet   3  . multivitamin-iron-minerals-folic acid (CENTRUM) chewable tablet Chew 1 tablet by mouth daily.    Marland Kitchen OVER THE COUNTER MEDICATION Magnesium 250 mg daily per patient    . simvastatin (ZOCOR) 10 MG tablet   3  . Turmeric Curcumin 500 MG CAPS Take 500 mg by mouth. 2 per day    . vitamin B-12 (CYANOCOBALAMIN) 1000 MCG tablet Take 5,000 mcg by mouth.    . zinc gluconate 50 MG tablet Take 50 mg by mouth daily.     No current facility-administered medications for this visit.     REVIEW OF SYSTEMS:   Constitutional: Denies fevers, chills or abnormal night sweats Eyes: Denies blurriness of vision, double vision or watery eyes Ears, nose, mouth, throat, and face: Denies mucositis or sore throat Respiratory: Denies cough, dyspnea or wheezes Cardiovascular: Denies palpitation, chest discomfort or lower extremity swelling Gastrointestinal:  Denies nausea, heartburn or change in bowel habits Skin: Denies abnormal skin rashes Lymphatics: Denies new lymphadenopathy or easy bruising Neurological:Denies numbness, tingling or new weaknesses Behavioral/Psych: Mood is stable, no new changes  Breast:  Denies any palpable lumps or discharge All other systems were reviewed with the patient and are negative.  PHYSICAL EXAMINATION: ECOG PERFORMANCE STATUS: 1 - Symptomatic but completely ambulatory  Vitals:   08/04/17 1247  BP: (!) 158/80  Pulse: 69  Resp: 18  Temp: 97.7 F (36.5 C)  SpO2: 100%   Filed Weights   08/04/17 1247  Weight: 133 lb (60.3 kg)    GENERAL:alert, no  distress and comfortable SKIN: skin color, texture, turgor are normal, no rashes or significant lesions EYES: normal, conjunctiva are pink and non-injected, sclera clear OROPHARYNX:no exudate, no erythema and lips, buccal mucosa, and tongue normal  NECK: supple, thyroid normal size, non-tender, without nodularity LYMPH:  no palpable lymphadenopathy in the cervical, axillary or inguinal LUNGS: clear to auscultation and percussion with normal breathing effort HEART: regular rate & rhythm and no murmurs and no lower extremity edema ABDOMEN:abdomen soft, non-tender and normal bowel sounds Musculoskeletal:no cyanosis of digits and no clubbing  PSYCH: alert & oriented x 3 with fluent speech NEURO: no focal motor/sensory deficits BREAST: No palpable nodules in right breast. No palpable axillary or supraclavicular lymphadenopathy (  exam performed in the presence of a chaperone)   LABORATORY DATA:  I have reviewed the data as listed Lab Results  Component Value Date   WBC 3.5 (L) 08/04/2017   HGB 14.1 08/04/2017   HCT 42.9 08/04/2017   MCV 87.4 08/04/2017   PLT 195 08/04/2017   Lab Results  Component Value Date   NA 141 08/04/2017   K 4.0 08/04/2017   CL 104 08/04/2017   CO2 30 (H) 08/04/2017    RADIOGRAPHIC STUDIES: I have personally reviewed the radiological reports and agreed with the findings in the report.  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 After reviewing the pathology in detail, we proceeded to discuss the different treatment options between surgery, radiation, and, antiestrogen therapies.  Recommendation: 1.  Genetics consult 2. breast conserving surgery plus or minus sentinel lymph node biopsy 3.  +/- XRT 4. Antiestrogen therapy with letrozole 2.5 mg daily x 5 years  Return to clinic after surgery to discuss final pathology report  All questions were answered. The patient knows to call the clinic with any problems, questions or concerns.    Harriette Ohara, MD 08/04/17

## 2017-08-04 NOTE — Progress Notes (Signed)
Radiation Oncology         [phone] ________________________________  Initial Outpatient Consultation  Name: Ana Coffey MRN: [id number]  Date: 08/04/2017  DOB: January 22, 1946  IE:PPIRJJO, Ana Penna, MD  Fanny Skates, MD   REFERRING PHYSICIAN: Fanny Skates, MD  DIAGNOSIS:    ICD-10-CM   1. 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 female with a personal history of left breast cancer treated with left mastectomy in 108 (age 69). She presented with a screening detected oval focal asymmetry in the right breast on 07/16/17 mammography.  Diagnostic mammogram of the right breast on 07/23/17 showed the oval nodule in the 9:00 position, 8 cm from the nipple, measuring 5 mm. Right axilla was negative on ultrasound. Biopsy of the nodule on 07/27/17 showed atypical fragments consistent with ductal carcinoma with characteristics as described above in the diagnosis. One of the fragments has a small amount of fibrotic stroma which indicates probable invasive carcinoma.  The patient presents to our [facility] today for evaluation and discussion of potential treatment options.   On review of systems, she is positive for skin rash. She is positive for diabetes and hot flashes.  She has a family history of breast cancer in her mother, diagnosed at age 32-49. Her mother later died of lung cancer (metastatic from breast, from what I gather) at age 24.  OB/GYN History: She had her first menstrual period at age 36. She is no longer having periods and has never used hormone replacement. She has carried two children to term, with the first birth at age 42.  She used birth control pills as contraception for about 10 years.  PREVIOUS RADIATION THERAPY: No  PAST MEDICAL HISTORY:  has a past medical history of Allergy, Breast cancer, left breast ([place]), Candida vaginitis, Diabetes mellitus without complication ([place]), Fibroids, Hypertension, Menopausal symptoms, Pelvic relaxation, and Postmenopausal bleeding.    PAST SURGICAL HISTORY: Past Surgical History:  Procedure Laterality Date  . MASTECTOMY  1986   left breast    FAMILY HISTORY: family history includes Breast cancer in her mother; Cancer in her mother.  SOCIAL HISTORY:  reports that she has never smoked. She has never used smokeless tobacco. She reports that she drinks alcohol. She reports that she does not use drugs. Married with 2 children. Retired.   ALLERGIES: Patient has no known allergies.  MEDICATIONS:  Current Outpatient Medications  Medication Sig Dispense Refill  . Cholecalciferol (VITAMIN D3) 1000 units CAPS Take by mouth daily.    . Cinnamon 500 MG TABS Take by mouth.    . fluticasone (FLONASE) 50 MCG/ACT nasal spray Place 2 sprays into both nostrils daily.    . hydrochlorothiazide (HYDRODIURIL) 25 MG tablet Take 25 mg by mouth daily.    Marland Kitchen ketoconazole (NIZORAL) 2 % cream     . metFORMIN (GLUCOPHAGE) 500 MG tablet   3  . multivitamin-iron-minerals-folic acid (CENTRUM) chewable tablet Chew 1 tablet by mouth daily.    Marland Kitchen OVER THE COUNTER MEDICATION Magnesium 250 mg daily per patient    . simvastatin (ZOCOR) 10 MG tablet   3  . Turmeric Curcumin 500 MG CAPS Take 500 mg by  mouth. 2 per day    . vitamin B-12 (CYANOCOBALAMIN) 1000 MCG tablet Take 5,000 mcg by mouth.    . zinc gluconate 50 MG tablet Take 50 mg by mouth daily.     No current facility-administered medications for this encounter.     REVIEW OF SYSTEMS: A 10+ POINT REVIEW OF SYSTEMS WAS OBTAINED including neurology, dermatology, psychiatry, cardiac, respiratory, lymph, extremities, GI, GU, Musculoskeletal,  constitutional, breasts, reproductive, HEENT.  All pertinent positives are noted in the HPI.  All others are negative.   PHYSICAL EXAM:  Vitals with BMI 08/04/2017  Height 5' 3.5"  Weight 133 lbs  BMI 31.51  Systolic 761  Diastolic 80  Pulse 69  Respirations 18   General: Alert and oriented, in no acute distress. HEENT: Head is normocephalic. Extraocular movements are intact. Oropharynx is clear. Neck: Neck is supple, no palpable cervical or supraclavicular lymphadenopathy. Heart: Regular in rate and rhythm with no murmurs, rubs, or gallops. Chest: Clear to auscultation bilaterally, with no rhonchi, wheezes, or rales. Abdomen: Soft, nontender, nondistended, with no rigidity or guarding. Extremities: No cyanosis or edema. Lymphatics: see Neck Exam Skin: No concerning lesions. Musculoskeletal: Symmetric strength and muscle tone throughout. Neurologic: Cranial nerves II through XII are grossly intact. No obvious focalities. Speech is fluent. Coordination is intact. Psychiatric: Judgment and insight are intact. Affect is appropriate. Breasts: Left reconstructed breast shows no sign of palpable or visible recurrence. No palpable axillary masses bilaterally. In the LOQ of the right breast she has ill-defined thickening over 3-4 cm consistent with recent biopsy.     ECOG = 0  0 - Asymptomatic (Fully active, able to carry on all predisease activities without restriction)  1 - Symptomatic but completely ambulatory (Restricted in physically strenuous activity but ambulatory and able to carry out work of a light or sedentary nature. For example, light housework, office work)  2 - Symptomatic, <50% in bed during the day (Ambulatory and capable of all self care but unable to carry out any work activities. Up and about more than 50% of waking hours)  3 - Symptomatic, >50% in bed, but not bedbound (Capable of only limited self-care, confined to bed or chair 50% or more of waking hours)  4 -  Bedbound (Completely disabled. Cannot carry on any self-care. Totally confined to bed or chair)  5 - Death   Eustace Pen MM, Creech RH, Tormey DC, et al. [phone]). "Toxicity and response criteria of the [facility]". [place] Oncol. 5 (6): 649-55   LABORATORY DATA:  Lab Results  Component Value Date   WBC 3.5 (L) 08/04/2017   HGB 14.1 08/04/2017   HCT 42.9 08/04/2017   MCV 87.4 08/04/2017   PLT 195 08/04/2017   CMP     Component Value Date/Time   NA 141 08/04/2017 1227   K 4.0 08/04/2017 1227   CL 104 08/04/2017 1227   CO2 30 (H) 08/04/2017 1227   GLUCOSE 109 08/04/2017 1227   BUN 16 08/04/2017 1227   CREATININE 0.94 08/04/2017 1227   CALCIUM 9.6 08/04/2017 1227   PROT 7.0 08/04/2017 1227   ALBUMIN 4.1 08/04/2017 1227   AST 25 08/04/2017 1227   ALT 25 08/04/2017 1227   ALKPHOS 70 08/04/2017 1227   BILITOT 0.4 08/04/2017 1227   GFRNONAA 59 (L) 08/04/2017 1227   GFRAA >60 08/04/2017 1227         RADIOGRAPHY: As above in HPI.    IMPRESSION/PLAN: Right Breast Cancer with previous history of  left breast cancer which was treated with mastectomy and chemotherapy 32 years ago  She will undergo genetic testing. She understands that she would have a heightened risk of recurrent breast cancer recurrence if certain mutations are detected, but she is quite firm that she would not want to undergo a mastectomy even if a mutation is revealed. She is enthusiastic about undergoing lumpectomy.  For the patient's early stage breast cancer, we had a thorough discussion about her options for adjuvant therapy. One option would be antiestrogen therapy as discussed with medical oncology. She would take a pill for approximately 5 years. The alternative option would be to undergo anti-estrogen treatment preceded by radiotherapy to the breast.   Of note, I discussed the data from the [facility] al trial in the [employer] [employer]. She understands that tamoxifen  compared to radiation plus tamoxifen demonstrated no survival benefit among the women in this study. The women were 71 years or older with stage I estrogen receptor positive breast cancer. Based on this study, I told the patient that her overall life expectancy should not be affected by adding radiotherapy to antiestrogen medication. She understands that the main benefit of adding radiotherapy to anti estrogen therapy would be a very small but measurable local control benefit (risk of local recurrence to be lowered from ~10% --> ~2% over a decade).  We discussed the fact that radiotherapy only provides a local control benefit while anti-estrogen pills provide systemic coverage. That being said, the risk of systemic failure is relatively low with her type of breast cancer. I acknowledged that her risk of recurrence could be heightened if she has a genetic mutation.  We discussed the risks benefits and side effects of radiotherapy. She understands that the side effects would likely include some skin irritation and fatigue during the weeks of radiation. There is a risk of late effects which include but are not necessarily limited to cosmetic changes and rare lung toxicity. I would anticipate delivering approximately 3.5-4 weeks of radiotherapy.  After a thorough discussion, the patient would like to pursue radiotherapy.  I will see her back for follow-up after surgery to arrange this.   __________________________________________   Eppie Gibson, MD  This document serves as a record of services personally performed by Eppie Gibson, MD. It was created on her behalf by Rae Lips, a trained medical scribe. The creation of this record is based on the scribe's personal observations and the provider's statements to them. This document has been checked and approved by the attending provider.

## 2017-08-04 NOTE — Therapy (Signed)
[facility] [place], [place], [address] Phone: [phone]   Fax:  [phone]  Physical Therapy Evaluation  Patient Details  Name: Ana Coffey MRN: [id number] Date of Birth: 09-17-1945 Referring Provider: Dr. Fanny Skates   Encounter Date: 08/04/2017  PT End of Session - 08/04/17 1902    Visit Number  1    Number of Visits  2    Date for PT Re-Evaluation  09/29/17    PT Start Time  1418    PT Stop Time  1440 Also saw pt from [phone] for a total of 30 minutes    PT Time Calculation (min)  22 min    Activity Tolerance  Patient tolerated treatment well    Behavior During Therapy  [facility] for tasks assessed/performed       Past Medical History:  Diagnosis Date  . Allergy   . Breast cancer, left breast ([place])   . Candida vaginitis   . Diabetes mellitus without complication ([place])   . Fibroids   . Hypertension   . Menopausal symptoms   . Pelvic relaxation   . Postmenopausal bleeding     Past Surgical History:  Procedure Laterality Date  . MASTECTOMY  1986   left breast    There were no vitals filed for this visit.   Subjective Assessment - 08/04/17 1854    Subjective  Patient reports she is here today to be seen by her medical team for her newly diagnosed right breast cancer.    Patient is accompained by:  Family member    Pertinent History  Patient was diagnosed on 07/16/17 with right grade II invasive ductal carcinoma breast cancer. It measures 5 mm and is located in the upper outer quadrant. It is ER/PR positive and HER2 negative with a Ki67 of 60%. She has a history of left breast cancer in 1986 and had a mastectomy and axillary lymph node dissection with 10 nodes removed. She also has diabetes.    Patient Stated Goals  Reduce risk of lymphedema and learn post op shoulder ROM HEP    Currently in Pain?  No/denies         [facility] PT Assessment - 08/04/17 0001      Assessment   Medical  Diagnosis  Right breast cancer    Referring Provider  Dr. Fanny Skates    Onset Date/Surgical Date  07/16/17    Hand Dominance  Right    Prior Therapy  none      Precautions   Precautions  Other (comment)    Precaution Comments  active cancer; left breast CA hx with left arm lymphedema risk      Restrictions   Weight Bearing Restrictions  No      Balance Screen   Has the patient fallen in the past 6 months  No    Has the patient had a decrease in activity level because of a fear of falling?   No    Is the patient reluctant to leave their home because of a fear of falling?   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, fast food but does not  walk fast or for exercise      Cognition   Overall Cognitive Status  Within Functional Limits for tasks assessed      Posture/Postural Control   Posture/Postural Control  Postural limitations    Postural Limitations  Rounded Shoulders;Forward head      ROM / Strength   AROM / PROM / Strength  AROM;Strength      AROM   AROM Assessment Site  Shoulder;Cervical    Right/Left Shoulder  Right;Left    Right Shoulder Extension  56 Degrees    Right Shoulder Flexion  143 Degrees    Right Shoulder ABduction  154 Degrees    Right Shoulder Internal Rotation  53 Degrees    Right Shoulder External Rotation  70 Degrees    Left Shoulder Extension  59 Degrees    Left Shoulder Flexion  140 Degrees    Left Shoulder ABduction  147 Degrees    Left Shoulder Internal Rotation  54 Degrees    Left Shoulder External Rotation  72 Degrees    Cervical Flexion  WNL    Cervical Extension  WNL    Cervical - Right Side Bend  WNL    Cervical - Left Side Bend  WNL    Cervical - Right Rotation  WNL    Cervical - Left  Rotation  WNL      Strength   Overall Strength  Within functional limits for tasks performed        LYMPHEDEMA/ONCOLOGY QUESTIONNAIRE - 08/04/17 1900      Type   Cancer Type  right breast cancer      Lymphedema Assessments   Lymphedema Assessments  Upper extremities      Right Upper Extremity Lymphedema   10 cm Proximal to Olecranon Process  30.6 cm    Olecranon Process  25.8 cm    10 cm Proximal to Ulnar Styloid Process  22.8 cm    Just Proximal to Ulnar Styloid Process  16 cm    Across Hand at [employer]  19.8 cm    At [place] of 2nd Digit  6.5 cm      Left Upper Extremity Lymphedema   10 cm Proximal to Olecranon Process  30 cm    Olecranon Process  25.4 cm    10 cm Proximal to Ulnar Styloid Process  21.9 cm    Just Proximal to Ulnar Styloid Process  15.9 cm    Across Hand at [employer]  19.3 cm    At [place] of 2nd Digit  6.4 cm             Objective measurements completed on examination: See above findings.     Patient was instructed today in a home exercise program today for post op shoulder range of motion. These included active assist shoulder flexion in sitting, scapular retraction, wall walking with shoulder abduction, and hands behind head external rotation.  She was encouraged to do these twice a day, holding 3 seconds and repeating 5 times when permitted by her physician.      PT Education - 08/04/17 1901    Education provided  Yes    Education Details  Lymphedema risk reduction and post op shoulder ROM HEP    Person(s) Educated  Patient;Spouse;Child(ren)    Methods  Explanation;Demonstration    Comprehension  Verbalized understanding;Returned demonstration          PT Long Term Goals - 08/04/17 1905      PT LONG TERM  GOAL #1   Title  Patient will demonstrate she has regained full shoulder ROM and function post operatively compared with baseline measurements.    Time  [facility] Goals -  08/04/17 1904      Patient will be able to verbalize understanding of pertinent lymphedema risk reduction practices relevant to her diagnosis specifically related to skin care.   Time  1    Period  Days    Status  Achieved      Patient will be able to return demonstrate and/or verbalize understanding of the post-op home exercise program related to regaining shoulder range of motion.   Time  1    Period  Days    Status  Achieved      Patient will be able to verbalize understanding of the importance of attending the postoperative After Breast Cancer Class for further lymphedema risk reduction education and therapeutic exercise.   Time  1    Period  Days    Status  Achieved            Plan - 08/04/17 1902    Clinical Impression Statement  Patient was diagnosed on 07/16/17 with right grade II invasive ductal carcinoma breast cancer. It measures 5 mm and is located in the upper outer quadrant. It is ER/PR positive and HER2 negative with a Ki67 of 60%. She has a history of left breast cancer in 1986 and had a mastectomy and axillary lymph node dissection with 10 nodes removed. She also has diabetes. Her multidisciplinary medical team met prior to her assessments to determine a recommended treatment plan. She is planning to have a right lumpectomy and sentinel node biopsy followed by radiation and anti-estrogen therapy. She will benefit from a post op PT visit to reassess and determine needs.    History and Personal Factors relevant to plan of care:  Previous left breast cancer and left arm lymphedema risk    Clinical Presentation  Stable    Clinical Decision Making  Low    Rehab Potential  Excellent    Clinical Impairments Affecting Rehab Potential  None    PT Frequency  -- Eval and 1 f/u visit    PT Treatment/Interventions  ADLs/Self Care Home Management;Therapeutic exercise;Patient/family education    PT Next Visit Plan  Will reassess 3-4 weeks post op to determine needs    PT Home  Exercise Plan  post op shoulder ROM HEP    Consulted and Agree with Plan of Care  Patient;Family member/caregiver    Family Member Consulted  Husband, daughter and son-in-law       Patient will benefit from skilled therapeutic intervention in order to improve the following deficits and impairments:  Decreased knowledge of precautions, Impaired UE functional use, Decreased range of motion, Postural dysfunction, Pain  Visit Diagnosis: Malignant neoplasm of upper-outer quadrant of right breast in female, estrogen receptor positive ([place]) - Plan: PT plan of care cert/re-cert  Abnormal posture - Plan: PT plan of care cert/re-cert   Patient will follow up at outpatient cancer rehab 3-4 weeks following surgery.  If the patient requires physical therapy at that time, a specific plan will be dictated and sent to the referring physician for approval. The patient was educated today on appropriate basic range of motion exercises to begin post operatively and the importance of attending the After Breast Cancer class following surgery.  Patient was educated  today on lymphedema risk reduction practices as it pertains to recommendations that will benefit the patient immediately following surgery.  She verbalized good understanding.      Problem List Patient Active Problem List   Diagnosis Date Noted  . Malignant neoplasm of upper-outer quadrant of right breast in female, estrogen receptor positive ([place]) 07/30/2017  . Obesity 08/04/2011  . Decreased libido 06/17/2010    Class: History of  . Pelvic relaxation 04/07/2000    Class: History of  . Menopausal symptom 04/16/1992    Class: History of  . Breast cancer, left breast ([place]) 03/05/1986    Class: History of    Annia Friendly, [place] 08/04/17 7:07 PM  [place], [place], [address] Phone: [phone]   Fax:  [phone]  Name: ALLESSANDRA BERNARDI MRN: [id number] Date  of Birth: 01/14/1946

## 2017-08-04 NOTE — Patient Instructions (Signed)

## 2017-08-04 NOTE — Assessment & Plan Note (Signed)
07/27/2017: Screening detected asymmetry in the left breast 0.4 cm at 9 o'clock position 8 cm from nipple axilla negative: Biopsy revealed DCIS with probable invasive carcinoma grade 2, ER 100%, PR 100%, HER-2 negative ratio 1.72, Ki-67 60%, T1 a N0 stage I a clinical stage AJCC 8  Left breast cancer 1986  Pathology and radiology counseling: Discussed with the patient, the details of pathology including the type of breast cancer,the clinical staging, the significance of ER, PR and HER-2/neu receptors and the implications for treatment. After reviewing the pathology in detail, we proceeded to discuss the different treatment options between surgery, radiation, and, antiestrogen therapies.  Recommendation: 1.  Genetics consult 2. breast conserving surgery plus or minus sentinel lymph node biopsy 3.  +/- XRT 4. Antiestrogen therapy with letrozole 2.5 mg daily x 5 years  Return to clinic after surgery to discuss final pathology report

## 2017-08-04 NOTE — Progress Notes (Signed)
Nutrition Assessment  Reason for Assessment:  Pt seen in Breast Clinic  ASSESSMENT:   72 year old female with new diagnosis of breast cancer.  Past medical history of DM, HTN.    Patient reports normal appetite.    Medications:  reviewed  Labs: reviewed  Anthropometrics:   Height: 63. 5 inches Weight: 133 lb BMI: 23   NUTRITION DIAGNOSIS: Food and nutrition related knowledge deficit related to new diagnosis of breast cancer as evidenced by no prior need for nutrition related information.  INTERVENTION:   Discussed and provided packet of information regarding nutritional tips for breast cancer patients.  Questions answered.  Teachback method used.  Contact information provided and patient knows to contact me with questions/concerns.    MONITORING, EVALUATION, and GOAL: Pt will consume a healthy plant based diet to maintain lean body mass throughout treatment.   Nasya Vincent B. Zenia Resides, [place], [place] Registered Dietitian [phone] (pager)

## 2017-08-05 ENCOUNTER — Encounter: Payer: Self-pay | Admitting: Radiation Oncology

## 2017-08-06 ENCOUNTER — Encounter: Payer: Self-pay | Admitting: Genetic Counselor

## 2017-08-06 DIAGNOSIS — Z1379 Encounter for other screening for genetic and chromosomal anomalies: Secondary | ICD-10-CM | POA: Insufficient documentation

## 2017-08-09 ENCOUNTER — Inpatient Hospital Stay: Payer: Medicare Other

## 2017-08-09 ENCOUNTER — Inpatient Hospital Stay: Payer: Medicare Other | Admitting: Genetics

## 2017-08-11 ENCOUNTER — Telehealth: Payer: Self-pay | Admitting: *Deleted

## 2017-08-11 DIAGNOSIS — Z170 Estrogen receptor positive status [ER+]: Principal | ICD-10-CM

## 2017-08-11 DIAGNOSIS — C50411 Malignant neoplasm of upper-outer quadrant of right female breast: Secondary | ICD-10-CM

## 2017-08-12 ENCOUNTER — Encounter: Payer: Self-pay | Admitting: General Practice

## 2017-08-12 ENCOUNTER — Telehealth: Payer: Self-pay | Admitting: Hematology and Oncology

## 2017-08-12 ENCOUNTER — Other Ambulatory Visit: Payer: Self-pay

## 2017-08-12 ENCOUNTER — Encounter (HOSPITAL_BASED_OUTPATIENT_CLINIC_OR_DEPARTMENT_OTHER): Payer: Self-pay | Admitting: *Deleted

## 2017-08-12 NOTE — Progress Notes (Signed)
[place] Psychosocial Distress Screening [place] following Breast Multidisciplinary Clinic to introduce Support Center team/resources, reviewing distress screen per protocol.  The patient scored a 10 on the Psychosocial Distress Thermometer which indicates severe distress. Also assessed for distress and other psychosocial needs.   ONCBCN DISTRESS SCREENING 08/12/2017  Screening Type Initial Screening  Distress experienced in past week (1-10) 10  Emotional problem type Nervousness/Anxiety  Information Concerns Type Lack of info about treatment  Physical Problem type Sleep/insomnia  Referral to support programs Yes   Ha was very appreciative of [facility] and Support Team. Per pt, her distress is dramatically lower now that she understands scope of dx/tx. She is Jehovah's Witness; Scripture and prayer are two sources of calm and hope for her. She reports good support from her kids and family.   Follow up needed: No. Per pt, no other needs at this time, but she knows to reach out anytime and has reviewed [place] team/programming packet. Please also page if needs arise or circumstances change. Thank you.   [place], [place], [facility] Pager [phone] Voicemail [phone]

## 2017-08-12 NOTE — Telephone Encounter (Signed)
Spoke to pt concerning [place] from 5.15.19. Denies questions or concerns regarding dx or treatment care plan. Encourage pt to call with needs. Received verbal understanding.

## 2017-08-12 NOTE — Telephone Encounter (Signed)
Mailed patient calendar of upcoming June appointments per 5/23 sch message

## 2017-08-12 NOTE — Telephone Encounter (Signed)
Mailed patient calendar of upcoming appointments per 5/23 sch message

## 2017-08-13 ENCOUNTER — Encounter (HOSPITAL_BASED_OUTPATIENT_CLINIC_OR_DEPARTMENT_OTHER)
Admission: RE | Admit: 2017-08-13 | Discharge: 2017-08-13 | Disposition: A | Discharge status: Home / Self Care | Payer: Medicare Other | Source: Ambulatory Visit | Attending: General Surgery | Admitting: General Surgery

## 2017-08-13 ENCOUNTER — Other Ambulatory Visit: Payer: Self-pay

## 2017-08-13 DIAGNOSIS — I10 Essential (primary) hypertension: Secondary | ICD-10-CM | POA: Diagnosis not present

## 2017-08-13 DIAGNOSIS — R9431 Abnormal electrocardiogram [ECG] [EKG]: Secondary | ICD-10-CM | POA: Insufficient documentation

## 2017-08-13 NOTE — Progress Notes (Signed)
Pt arrived for EKG. Given hibiclens soap and instructed to use half night before and half morning of surgery. Encouraged to use caution as soap may cause shower floor to become slick. Given ensure presurgery drink (instructions taped to bottle) and instructed to finish drinking DOS by 0715. Pt verbalized understanding of all instructions.

## 2017-08-15 NOTE — H&P (Signed)
Ana Coffey Location: [facility] Surgery Patient #: [id number] DOB: [birth year] Undefined / Language: Ana Coffey / Race: White Female         History of Present Illness         This is a very pleasant 72 year old woman. She is referred by Dr. 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 She is evaluated in the [facility] today by Dr. Lindi Adie, Dr. Isidore Moos, and me. Dr. Deforest Hoyles is her PCP. Dr. Raphael Gibney is her gynecologist. Her husband, daughter, and son-in-law are present with her throughout the encounter       Significant past history is left modified radical mastectomy age 34, 15. I was her Psychologist, sport and exercise. She was node negative but got chemotherapy. She subsequently had an implant placed by Dr. best on the left and a reduction mammoplasty on the right. Recent screening studies show a 4-5 mm mass in the right breast somewhere between the 9:00 and 11 o'clock position, middle depth. No other abnormalities were noted of the axilla looked fine. Image guided biopsy shows fragments of in situ and probably invasive cancer. Grade 2. Estrogen and progesterone receptors 100%. Ki-67 60% daily. HER-2 negative. She is motivated for breast conservation think she is a good candidate for that. She has been offered genetic counseling and she is interested in that as well but does not think that the result would affect her surgical preference. We talked about this for a long time.      Past history is significant for the left mastectomy, chemotherapy, left breast implant, right reduction mammoplasty. Non-insulin-dependent diabetes mellitus. Family history significant that mother was diagnosed with breast cancer age 82 and had metastatic disease and succumbed to that. Brother also was diagnosed with breast cancer and was treated and is alive. Social history she is married. She is a Restaurant manager, fast food. Denies tobacco.  Alcohol rare. Has 2 daughters. 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      We talked about options for surgical management. We talked about mastectomy with or without reconstruction, sentinel node biopsy, lumpectomy and sentinel node biopsy. She wanted breast conservation. She is a good candidate for that She will be scheduled for right breast lumpectomy with radioactive seed localization and right axillary deep sentinel lymph node biopsy. I discussed the indications, details, techniques, and numerous risk of the surgery with her and her family. She is aware the risk of bleeding, infection, reoperation for positive margins or positive nodes, cosmetic deformity, chronic pain, arm swelling, arm numbness, recurrent disease and other unforeseen problems. She understands these issues well. All of her questions were answered. She agrees with this plan.     Following surgery she will likely be offered whole breast radiation therapy and antiestrogen therapy. She has been referred for genetic testing.   Past Surgical History  Breast Biopsy  Bilateral. multiple Mammoplasty; Reduction  Right. Mastectomy  Left. Oral Surgery  Tonsillectomy   Diagnostic Studies History  Colonoscopy  1-5 years ago Mammogram  within last year Pap Smear  1-5 years ago  Medication History  Medications Reconciled  Social History  Alcohol use  Occasional alcohol use. Caffeine use  Coffee. No drug use  Tobacco use  Never smoker.  Family History  Alcohol Abuse  Family Members In General. Arthritis  Family Members In General. Bleeding disorder  Family Members In General. Breast Cancer  Brother, Family Members In General, Mother.  Cancer  Family Members In General. Diabetes Mellitus  Brother, Family Members In General. Respiratory Condition  Mother. Seizure disorder  Family Members In General.  Pregnancy / Birth History  Age at menarche  46 years. Age  of menopause  [age] Contraceptive History  Oral contraceptives. Gravida  2 Irregular periods  Maternal age  50-25 Para  2  Other Problems  Breast Cancer  Cancer  Diabetes Mellitus  High blood pressure  Hypercholesterolemia  Lump In Breast     Review of Systems  General Present- Night Sweats. Not Present- Appetite Loss, Chills, Fatigue, Fever, Weight Gain and Weight Loss. Skin Present- Rash. Not Present- Change in Wart/Mole, Dryness, Hives, Jaundice, New Lesions, Non-Healing Wounds and Ulcer. HEENT Present- Seasonal Allergies, Sinus Pain and Wears glasses/contact lenses. Not Present- Earache, Hearing Loss, Hoarseness, Nose Bleed, Oral Ulcers, Ringing in the Ears, Sore Throat, Visual Disturbances and Yellow Eyes. Respiratory Not Present- Bloody sputum, Chronic Cough, Difficulty Breathing, Snoring and Wheezing. Breast Not Present- Breast Mass, Breast Pain, Nipple Discharge and Skin Changes. Cardiovascular Present- Leg Cramps. Not Present- Chest Pain, Difficulty Breathing Lying Down, Palpitations, Rapid Heart Rate, Shortness of Breath and Swelling of Extremities. Gastrointestinal Not Present- Abdominal Pain, Bloating, Bloody Stool, Change in Bowel Habits, Chronic diarrhea, Constipation, Difficulty Swallowing, Excessive gas, Gets full quickly at meals, Hemorrhoids, Indigestion, Nausea, Rectal Pain and Vomiting. Female Genitourinary Present- Frequency. Not Present- Nocturia, Painful Urination, Pelvic Pain and Urgency. Musculoskeletal Not Present- Back Pain, Joint Pain, Joint Stiffness, Muscle Pain, Muscle Weakness and Swelling of Extremities. Neurological Not Present- Decreased Memory, Fainting, Headaches, Numbness, Seizures, Tingling, Tremor, Trouble walking and Weakness. Psychiatric Not Present- Anxiety, Bipolar, Change in Sleep Pattern, Depression, Fearful and Frequent crying. Endocrine Present- Hot flashes and New Diabetes. Not Present- Cold Intolerance, Excessive Hunger, Hair  Changes and Heat Intolerance.   Physical Exam General Mental Status-Alert. General Appearance-Consistent with stated age. Hydration-Well hydrated. Voice-Normal.  Head and Neck Head-normocephalic, atraumatic with no lesions or palpable masses. Trachea-midline. Thyroid Gland Characteristics - normal size and consistency.  Eye Eyeball - Bilateral-Extraocular movements intact. Sclera/Conjunctiva - Bilateral-No scleral icterus.  Chest and Lung Exam Chest and lung exam reveals -quiet, even and easy respiratory effort with no use of accessory muscles and on auscultation, normal breath sounds, no adventitious sounds and normal vocal resonance. Inspection Chest Wall - Normal. Back - normal.  Breast Note: Right breast intact. Well-healed reduction mammoplasty scars with anchor pattern. Ecchymoses noted laterally on the right but no real mass. No axillary adenopathy. Left mastectomy incision well-healed. Implant palpable. Nipple reconstruction noted. No axillary mass on left.   Cardiovascular Cardiovascular examination reveals -normal heart sounds, regular rate and rhythm with no murmurs and normal pedal pulses bilaterally.  Abdomen Inspection Inspection of the abdomen reveals - No Hernias. Skin - Scar - no surgical scars. Palpation/Percussion Palpation and Percussion of the abdomen reveal - Soft, Non Tender, No Rebound tenderness, No Rigidity (guarding) and No hepatosplenomegaly. Auscultation Auscultation of the abdomen reveals - Bowel sounds normal.  Neurologic Neurologic evaluation reveals -alert and oriented x 3 with no impairment of recent or remote memory. Mental Status-Normal.  Musculoskeletal Normal Exam - Left-Upper Extremity Strength Normal and Lower Extremity Strength Normal. Normal Exam - Right-Upper Extremity Strength Normal and Lower Extremity Strength Normal.  Lymphatic Head & Neck  General Head & Neck Lymphatics: Bilateral -  Description - Normal. Axillary  General Axillary Region: Bilateral - Description - Normal. Tenderness - Non Tender. Femoral & Inguinal  Generalized Femoral & Inguinal Lymphatics: Bilateral - Description -  Normal. Tenderness - Non Tender.    Assessment & Plan PRIMARY CANCER OF UPPER OUTER QUADRANT OF RIGHT FEMALE BREAST (C50.411)   your recent imaging studies and biopsy show a small cancer in the right breast, upper outer quadrant The cancer is 4-5 mm in diameter Biopsy shows fragmented invasive ductal carcinoma. proliferation index is high at 60%. HER-2 negative. estrogen and progesterone receptors positive Axillary ultrasound negative  the first step in your treatment is surgery. we discussed the surgical option of mastectomy and sentinel lymph node biopsy, with or without reconstruction. We also discussed the option of lumpectomy and sentinel lymph node biopsy.  We have discussed the role of radiation therapy and antiestrogen hormone treatment. You'll probably not need chemotherapy.  you have stated your preference for breast conservation, and I think you are a good candidate for that. You'll be scheduled for right breast lumpectomy with sentinel lymph node biopsy and right axillary sentinel lymph node biopsy. Dr. Dalbert Batman has discussed the indications, techniques, and risk of the surgery in detail.  Dr. Darrel Hoover office will contact you tomorrow to begin the scheduling process.  you are a Jehovah's Witness and we will respect all of your preferences regarding blood transfusion. I do not anticipate any significant blood loss. You were advised to begin iron therapy.  HISTORY OF LEFT MASTECTOMY (Z90.12) FAMILY HISTORY OF BREAST CANCER IN MOTHER (Z80.3) FAMILY HISTORY OF BREAST CANCER IN FEMALE (Z80.3) PATIENT IS JEHOVAH'S WITNESS (Z78.9) HISTORY OF REDUCTION MAMMOPLASTY (Z98.890) Impression: right side DIABETES MELLITUS, WITHOUT LONG-TERM CURRENT USE OF INSULIN  (E11.9)    Haywood M. Dalbert Batman, M.D., [facility] General and Minimally invasive Surgery Breast and Colorectal Surgery Office:   [phone] Pager:   [phone]

## 2017-08-17 DIAGNOSIS — C50811 Malignant neoplasm of overlapping sites of right female breast: Secondary | ICD-10-CM | POA: Diagnosis not present

## 2017-08-18 ENCOUNTER — Ambulatory Visit (HOSPITAL_BASED_OUTPATIENT_CLINIC_OR_DEPARTMENT_OTHER)
Admission: RE | Admit: 2017-08-18 | Discharge: 2017-08-18 | Disposition: A | Discharge status: Home / Self Care | Payer: Medicare Other | Source: Ambulatory Visit | Attending: General Surgery | Admitting: General Surgery

## 2017-08-18 ENCOUNTER — Ambulatory Visit (HOSPITAL_BASED_OUTPATIENT_CLINIC_OR_DEPARTMENT_OTHER): Payer: Medicare Other | Admitting: Anesthesiology

## 2017-08-18 ENCOUNTER — Encounter (HOSPITAL_BASED_OUTPATIENT_CLINIC_OR_DEPARTMENT_OTHER)
Admission: RE | Disposition: A | Discharge status: Home / Self Care | Payer: Self-pay | Source: Ambulatory Visit | Attending: General Surgery

## 2017-08-18 ENCOUNTER — Ambulatory Visit (HOSPITAL_COMMUNITY)
Admission: RE | Admit: 2017-08-18 | Discharge: 2017-08-18 | Disposition: A | Discharge status: Home / Self Care | Payer: Medicare Other | Source: Ambulatory Visit | Attending: General Surgery | Admitting: General Surgery

## 2017-08-18 ENCOUNTER — Encounter (HOSPITAL_BASED_OUTPATIENT_CLINIC_OR_DEPARTMENT_OTHER): Payer: Self-pay | Admitting: Anesthesiology

## 2017-08-18 ENCOUNTER — Other Ambulatory Visit: Payer: Self-pay

## 2017-08-18 DIAGNOSIS — Z170 Estrogen receptor positive status [ER+]: Principal | ICD-10-CM

## 2017-08-18 DIAGNOSIS — C50411 Malignant neoplasm of upper-outer quadrant of right female breast: Secondary | ICD-10-CM

## 2017-08-18 DIAGNOSIS — E119 Type 2 diabetes mellitus without complications: Secondary | ICD-10-CM | POA: Diagnosis not present

## 2017-08-18 DIAGNOSIS — C50811 Malignant neoplasm of overlapping sites of right female breast: Secondary | ICD-10-CM | POA: Diagnosis not present

## 2017-08-18 DIAGNOSIS — Z803 Family history of malignant neoplasm of breast: Secondary | ICD-10-CM | POA: Diagnosis not present

## 2017-08-18 DIAGNOSIS — Z9221 Personal history of antineoplastic chemotherapy: Secondary | ICD-10-CM | POA: Insufficient documentation

## 2017-08-18 DIAGNOSIS — Z9012 Acquired absence of left breast and nipple: Secondary | ICD-10-CM | POA: Diagnosis not present

## 2017-08-18 DIAGNOSIS — G8918 Other acute postprocedural pain: Secondary | ICD-10-CM | POA: Diagnosis not present

## 2017-08-18 DIAGNOSIS — I10 Essential (primary) hypertension: Secondary | ICD-10-CM | POA: Diagnosis not present

## 2017-08-18 DIAGNOSIS — C50911 Malignant neoplasm of unspecified site of right female breast: Secondary | ICD-10-CM | POA: Diagnosis not present

## 2017-08-18 DIAGNOSIS — D0511 Intraductal carcinoma in situ of right breast: Secondary | ICD-10-CM | POA: Diagnosis not present

## 2017-08-18 DIAGNOSIS — E7800 Pure hypercholesterolemia, unspecified: Secondary | ICD-10-CM | POA: Diagnosis not present

## 2017-08-18 DIAGNOSIS — Z7984 Long term (current) use of oral hypoglycemic drugs: Secondary | ICD-10-CM | POA: Diagnosis not present

## 2017-08-18 HISTORY — PX: BREAST LUMPECTOMY WITH RADIOACTIVE SEED AND SENTINEL LYMPH NODE BIOPSY: SHX6550

## 2017-08-18 LAB — GLUCOSE, CAPILLARY: GLUCOSE-CAPILLARY: 134 mg/dL — AB (ref 65–99)

## 2017-08-18 SURGERY — BREAST LUMPECTOMY WITH RADIOACTIVE SEED AND SENTINEL LYMPH NODE BIOPSY
Anesthesia: General | Site: Breast | Laterality: Right

## 2017-08-18 MED ORDER — TRAMADOL HCL 50 MG PO TABS: 50.0000 mg | ORAL_TABLET | Freq: Four times a day (QID) | ORAL | 0 refills | Status: DC | PRN

## 2017-08-18 MED ORDER — MIDAZOLAM HCL 2 MG/2ML IJ SOLN
INTRAMUSCULAR | Status: AC
  Filled 2017-08-18: qty 2

## 2017-08-18 MED ORDER — EPHEDRINE SULFATE 50 MG/ML IJ SOLN
INTRAMUSCULAR | Status: DC | PRN
  Administered 2017-08-18 (×2): 25 mg via INTRAVENOUS

## 2017-08-18 MED ORDER — ACETAMINOPHEN 650 MG RE SUPP: 650.0000 mg | RECTAL | Status: DC | PRN

## 2017-08-18 MED ORDER — SODIUM CHLORIDE 0.9 % IV SOLN: INTRAVENOUS | Status: DC

## 2017-08-18 MED ORDER — SODIUM CHLORIDE 0.9% FLUSH: 3.0000 mL | INTRAVENOUS | Status: DC | PRN

## 2017-08-18 MED ORDER — SCOPOLAMINE 1 MG/3DAYS TD PT72: 1.0000 | MEDICATED_PATCH | Freq: Once | TRANSDERMAL | Status: DC | PRN

## 2017-08-18 MED ORDER — BUPIVACAINE-EPINEPHRINE 0.5% -1:200000 IJ SOLN
INTRAMUSCULAR | Status: DC | PRN
  Administered 2017-08-18: 15 mL

## 2017-08-18 MED ORDER — EPHEDRINE SULFATE 50 MG/ML IJ SOLN
INTRAMUSCULAR | Status: AC
  Filled 2017-08-18: qty 1

## 2017-08-18 MED ORDER — ONDANSETRON HCL 4 MG/2ML IJ SOLN: 4.0000 mg | Freq: Once | INTRAMUSCULAR | Status: DC | PRN

## 2017-08-18 MED ORDER — DEXAMETHASONE SODIUM PHOSPHATE 10 MG/ML IJ SOLN
INTRAMUSCULAR | Status: AC
  Filled 2017-08-18: qty 1

## 2017-08-18 MED ORDER — TECHNETIUM TC 99M SULFUR COLLOID FILTERED
1.0000 | Freq: Once | INTRAVENOUS | Status: AC | PRN
  Administered 2017-08-18: 1 via INTRADERMAL

## 2017-08-18 MED ORDER — SUCCINYLCHOLINE CHLORIDE 200 MG/10ML IV SOSY
PREFILLED_SYRINGE | INTRAVENOUS | Status: AC
  Filled 2017-08-18: qty 10

## 2017-08-18 MED ORDER — EPHEDRINE SULFATE 50 MG/ML IJ SOLN
INTRAMUSCULAR | Status: AC
  Filled 2017-08-18: qty 2

## 2017-08-18 MED ORDER — HYDRALAZINE HCL 20 MG/ML IJ SOLN
5.0000 mg | Freq: Once | INTRAMUSCULAR | Status: AC
  Administered 2017-08-18: 5 mg via INTRAVENOUS

## 2017-08-18 MED ORDER — FENTANYL CITRATE (PF) 100 MCG/2ML IJ SOLN
INTRAMUSCULAR | Status: DC | PRN
  Administered 2017-08-18: 100 ug via INTRAVENOUS

## 2017-08-18 MED ORDER — CELECOXIB 100 MG PO CAPS
ORAL_CAPSULE | ORAL | Status: AC
  Filled 2017-08-18: qty 1

## 2017-08-18 MED ORDER — 0.9 % SODIUM CHLORIDE (POUR BTL) OPTIME
TOPICAL | Status: DC | PRN
  Administered 2017-08-18: 500 mL

## 2017-08-18 MED ORDER — FENTANYL CITRATE (PF) 100 MCG/2ML IJ SOLN
INTRAMUSCULAR | Status: AC
  Filled 2017-08-18: qty 2

## 2017-08-18 MED ORDER — CEFAZOLIN SODIUM-DEXTROSE 2-4 GM/100ML-% IV SOLN
INTRAVENOUS | Status: AC
Start: 2017-08-18 — End: ?
  Filled 2017-08-18: qty 100

## 2017-08-18 MED ORDER — SODIUM CHLORIDE 0.9 % IJ SOLN
INTRAMUSCULAR | Status: AC
  Filled 2017-08-18: qty 10

## 2017-08-18 MED ORDER — ROPIVACAINE HCL 5 MG/ML IJ SOLN
INTRAMUSCULAR | Status: DC | PRN
  Administered 2017-08-18: 30 mL via PERINEURAL

## 2017-08-18 MED ORDER — PROPOFOL 10 MG/ML IV BOLUS
INTRAVENOUS | Status: AC
  Filled 2017-08-18: qty 20

## 2017-08-18 MED ORDER — ACETAMINOPHEN 500 MG PO TABS
ORAL_TABLET | ORAL | Status: AC
  Filled 2017-08-18: qty 2

## 2017-08-18 MED ORDER — GABAPENTIN 300 MG PO CAPS
ORAL_CAPSULE | ORAL | Status: AC
  Filled 2017-08-18: qty 1

## 2017-08-18 MED ORDER — GABAPENTIN 300 MG PO CAPS
300.0000 mg | ORAL_CAPSULE | ORAL | Status: AC
  Administered 2017-08-18: 300 mg via ORAL

## 2017-08-18 MED ORDER — CHLORHEXIDINE GLUCONATE CLOTH 2 % EX PADS: 6.0000 | MEDICATED_PAD | Freq: Once | CUTANEOUS | Status: DC

## 2017-08-18 MED ORDER — ONDANSETRON HCL 4 MG/2ML IJ SOLN
INTRAMUSCULAR | Status: AC
  Filled 2017-08-18: qty 2

## 2017-08-18 MED ORDER — SODIUM CHLORIDE 0.9% FLUSH: 3.0000 mL | Freq: Two times a day (BID) | INTRAVENOUS | Status: DC

## 2017-08-18 MED ORDER — PROPOFOL 10 MG/ML IV BOLUS
INTRAVENOUS | Status: DC | PRN
  Administered 2017-08-18: 150 mg via INTRAVENOUS

## 2017-08-18 MED ORDER — CELECOXIB 100 MG PO CAPS
100.0000 mg | ORAL_CAPSULE | ORAL | Status: AC
  Administered 2017-08-18: 100 mg via ORAL

## 2017-08-18 MED ORDER — DEXAMETHASONE SODIUM PHOSPHATE 4 MG/ML IJ SOLN
INTRAMUSCULAR | Status: DC | PRN
  Administered 2017-08-18: 10 mg via INTRAVENOUS

## 2017-08-18 MED ORDER — ONDANSETRON HCL 4 MG/2ML IJ SOLN
INTRAMUSCULAR | Status: DC | PRN
  Administered 2017-08-18: 4 mg via INTRAVENOUS

## 2017-08-18 MED ORDER — FENTANYL CITRATE (PF) 100 MCG/2ML IJ SOLN: 25.0000 ug | INTRAMUSCULAR | Status: DC | PRN

## 2017-08-18 MED ORDER — ACETAMINOPHEN 500 MG PO TABS
1000.0000 mg | ORAL_TABLET | ORAL | Status: AC
  Administered 2017-08-18: 1000 mg via ORAL

## 2017-08-18 MED ORDER — ACETAMINOPHEN 325 MG PO TABS: 650.0000 mg | ORAL_TABLET | ORAL | Status: DC | PRN

## 2017-08-18 MED ORDER — MIDAZOLAM HCL 2 MG/2ML IJ SOLN
1.0000 mg | INTRAMUSCULAR | Status: DC | PRN
  Administered 2017-08-18 (×2): 1 mg via INTRAVENOUS

## 2017-08-18 MED ORDER — OXYCODONE HCL 5 MG PO TABS: 5.0000 mg | ORAL_TABLET | ORAL | Status: DC | PRN

## 2017-08-18 MED ORDER — SODIUM CHLORIDE 0.9 % IJ SOLN
INTRAVENOUS | Status: DC | PRN
  Administered 2017-08-18: 5 mL via INTRAMUSCULAR

## 2017-08-18 MED ORDER — METHYLENE BLUE 0.5 % INJ SOLN
INTRAVENOUS | Status: AC
  Filled 2017-08-18: qty 10

## 2017-08-18 MED ORDER — LIDOCAINE HCL (CARDIAC) PF 100 MG/5ML IV SOSY
PREFILLED_SYRINGE | INTRAVENOUS | Status: AC
  Filled 2017-08-18: qty 5

## 2017-08-18 MED ORDER — BUPIVACAINE-EPINEPHRINE (PF) 0.5% -1:200000 IJ SOLN
INTRAMUSCULAR | Status: AC
  Filled 2017-08-18: qty 90

## 2017-08-18 MED ORDER — PHENYLEPHRINE 40 MCG/ML (10ML) SYRINGE FOR IV PUSH (FOR BLOOD PRESSURE SUPPORT)
PREFILLED_SYRINGE | INTRAVENOUS | Status: AC
  Filled 2017-08-18: qty 10

## 2017-08-18 MED ORDER — HYDRALAZINE HCL 20 MG/ML IJ SOLN
INTRAMUSCULAR | Status: AC
  Filled 2017-08-18: qty 1

## 2017-08-18 MED ORDER — SODIUM CHLORIDE 0.9 % IV SOLN: 250.0000 mL | INTRAVENOUS | Status: DC | PRN

## 2017-08-18 MED ORDER — FENTANYL CITRATE (PF) 100 MCG/2ML IJ SOLN: 50.0000 ug | INTRAMUSCULAR | Status: DC | PRN

## 2017-08-18 MED ORDER — LACTATED RINGERS IV SOLN
INTRAVENOUS | Status: DC
  Administered 2017-08-18 (×2): via INTRAVENOUS

## 2017-08-18 MED ORDER — LIDOCAINE HCL (CARDIAC) PF 100 MG/5ML IV SOSY
PREFILLED_SYRINGE | INTRAVENOUS | Status: DC | PRN
  Administered 2017-08-18: 30 mg via INTRAVENOUS

## 2017-08-18 MED ORDER — CEFAZOLIN SODIUM-DEXTROSE 2-4 GM/100ML-% IV SOLN
2.0000 g | INTRAVENOUS | Status: AC
  Administered 2017-08-18: 2 g via INTRAVENOUS

## 2017-08-18 SURGICAL SUPPLY — 56 items
ADH SKN CLS APL DERMABOND .7 (GAUZE/BANDAGES/DRESSINGS) ×1
APPLIER CLIP 11 MED OPEN (CLIP) ×3
APR CLP MED 11 20 MLT OPN (CLIP) ×1
BINDER BREAST XLRG (GAUZE/BANDAGES/DRESSINGS) ×2 IMPLANT
BLADE HEX COATED 2.75 (ELECTRODE) ×3 IMPLANT
BLADE SURG 15 STRL LF DISP TIS (BLADE) ×2 IMPLANT
BLADE SURG 15 STRL SS (BLADE) ×6
CANISTER SUCT 1200ML W/VALVE (MISCELLANEOUS) ×3 IMPLANT
CHLORAPREP W/TINT 26ML (MISCELLANEOUS) ×3 IMPLANT
CLIP APPLIE 11 MED OPEN (CLIP) ×1 IMPLANT
COVER BACK TABLE 60X90IN (DRAPES) ×3 IMPLANT
COVER MAYO STAND STRL (DRAPES) ×3 IMPLANT
COVER PROBE W GEL 5X96 (DRAPES) ×3 IMPLANT
DERMABOND ADVANCED (GAUZE/BANDAGES/DRESSINGS) ×2
DERMABOND ADVANCED .7 DNX12 (GAUZE/BANDAGES/DRESSINGS) IMPLANT
DEVICE DUBIN W/COMP PLATE 8390 (MISCELLANEOUS) ×3 IMPLANT
DRAPE LAPAROSCOPIC ABDOMINAL (DRAPES) ×3 IMPLANT
DRAPE UTILITY XL STRL (DRAPES) ×3 IMPLANT
DRSG PAD ABDOMINAL 8X10 ST (GAUZE/BANDAGES/DRESSINGS) ×2 IMPLANT
ELECT REM PT RETURN 9FT ADLT (ELECTROSURGICAL) ×3
ELECTRODE REM PT RTRN 9FT ADLT (ELECTROSURGICAL) ×1 IMPLANT
GAUZE SPONGE 4X4 12PLY STRL (GAUZE/BANDAGES/DRESSINGS) ×3 IMPLANT
GAUZE SPONGE 4X4 12PLY STRL LF (GAUZE/BANDAGES/DRESSINGS) ×2 IMPLANT
GLOVE BIO SURGEON STRL SZ 6.5 (GLOVE) ×1 IMPLANT
GLOVE BIO SURGEONS STRL SZ 6.5 (GLOVE) ×1
GLOVE BIOGEL PI IND STRL 7.0 (GLOVE) IMPLANT
GLOVE BIOGEL PI INDICATOR 7.0 (GLOVE) ×2
GLOVE EUDERMIC 7 POWDERFREE (GLOVE) ×3 IMPLANT
GOWN STRL REUS W/ TWL LRG LVL3 (GOWN DISPOSABLE) ×1 IMPLANT
GOWN STRL REUS W/ TWL XL LVL3 (GOWN DISPOSABLE) ×1 IMPLANT
GOWN STRL REUS W/TWL LRG LVL3 (GOWN DISPOSABLE) ×3
GOWN STRL REUS W/TWL XL LVL3 (GOWN DISPOSABLE) ×3
KIT MARKER MARGIN INK (KITS) ×3 IMPLANT
NDL HYPO 25X1 1.5 SAFETY (NEEDLE) ×2 IMPLANT
NDL SAFETY ECLIPSE 18X1.5 (NEEDLE) ×1 IMPLANT
NEEDLE HYPO 18GX1.5 SHARP (NEEDLE) ×3
NEEDLE HYPO 25X1 1.5 SAFETY (NEEDLE) ×6 IMPLANT
NS IRRIG 1000ML POUR BTL (IV SOLUTION) ×3 IMPLANT
PACK BASIN DAY SURGERY FS (CUSTOM PROCEDURE TRAY) ×3 IMPLANT
PAD ALCOHOL SWAB (MISCELLANEOUS) ×3 IMPLANT
PENCIL BUTTON HOLSTER BLD 10FT (ELECTRODE) ×3 IMPLANT
SHEET MEDIUM DRAPE 40X70 STRL (DRAPES) ×3 IMPLANT
SLEEVE SCD COMPRESS KNEE MED (MISCELLANEOUS) ×3 IMPLANT
SPONGE LAP 4X18 RFD (DISPOSABLE) ×5 IMPLANT
SUT MNCRL AB 4-0 PS2 18 (SUTURE) ×8 IMPLANT
SUT SILK 2 0 SH (SUTURE) ×3 IMPLANT
SUT VIC AB 2-0 CT1 27 (SUTURE)
SUT VIC AB 2-0 CT1 TAPERPNT 27 (SUTURE) IMPLANT
SUT VIC AB 3-0 SH 27 (SUTURE)
SUT VIC AB 3-0 SH 27X BRD (SUTURE) IMPLANT
SUT VICRYL 3-0 CR8 SH (SUTURE) ×3 IMPLANT
SYR 10ML LL (SYRINGE) ×6 IMPLANT
TOWEL GREEN STERILE FF (TOWEL DISPOSABLE) ×4 IMPLANT
TUBE CONNECTING 20'X1/4 (TUBING) ×1
TUBE CONNECTING 20X1/4 (TUBING) ×2 IMPLANT
YANKAUER SUCT BULB TIP NO VENT (SUCTIONS) ×3 IMPLANT

## 2017-08-18 NOTE — Anesthesia Postprocedure Evaluation (Signed)
Anesthesia Post Note  Patient: Ana Coffey  Procedure(s) Performed: BREAST LUMPECTOMY WITH RADIOACTIVE SEED AND DEEP AXILLARY SENTINEL LYMPH NODE BIOPSY WITH BLUE DYE INJECTION (Right Breast)     Patient location during evaluation: PACU Anesthesia Type: General and Regional Level of consciousness: awake and alert Pain management: pain level controlled Vital Signs Assessment: post-procedure vital signs reviewed and stable Respiratory status: spontaneous breathing, nonlabored ventilation, respiratory function stable and patient connected to nasal cannula oxygen Cardiovascular status: blood pressure returned to baseline and stable Postop Assessment: no apparent nausea or vomiting Anesthetic complications: no    Last Vitals:  Vitals:   08/18/17 1346 08/18/17 1430  BP: (!) 185/85 (!) 188/78  Pulse:  87  Resp:  18  Temp:  36.6 C  SpO2:  99%    Last Pain:  Vitals:   08/18/17 1430  TempSrc:   PainSc: 0-No pain                 Chrysa Rampy P Randon Somera

## 2017-08-18 NOTE — Transfer of Care (Signed)
Immediate Anesthesia Transfer of Care Note  Patient: Ana Coffey  Procedure(s) Performed: BREAST LUMPECTOMY WITH RADIOACTIVE SEED AND DEEP AXILLARY SENTINEL LYMPH NODE BIOPSY WITH BLUE DYE INJECTION (Right Breast)  Patient Location: PACU  Anesthesia Type:GA combined with regional for post-op pain  Level of Consciousness: sedated  Airway & Oxygen Therapy: Patient Spontanous Breathing and Patient connected to face mask oxygen  Post-op Assessment: Report given to RN and Post -op Vital signs reviewed and stable  Post vital signs: Reviewed and stable  Last Vitals:  Vitals Value Taken Time  BP    Temp    Pulse 80 08/18/2017 12:41 PM  Resp 12 08/18/2017 12:41 PM  SpO2 100 % 08/18/2017 12:41 PM  Vitals shown include unvalidated device data.  Last Pain:  Vitals:   08/18/17 1015  TempSrc: Oral  PainSc: 0-No pain      Patients Stated Pain Goal: 0 (00/51/10 2111)  Complications: No apparent anesthesia complications

## 2017-08-18 NOTE — Progress Notes (Signed)
Assisted Dr. Ellender with right, ultrasound guided, pectoralis block. Side rails up, monitors on throughout procedure. See vital signs in flow sheet. Tolerated Procedure well. °

## 2017-08-18 NOTE — Op Note (Signed)
Patient Name:           Ana Coffey   Date of Surgery:        08/18/2017  Pre op Diagnosis:      Invasive cancer right breast, upper outer quadrant, estrogen receptor positive  Post op Diagnosis:    Same  Procedure:                 Inject blue dye right breast                                     Right breast lumpectomy with radioactive seed localization                                       Right axillary deep sentinel lymph node biopsy  Surgeon:                     Edsel Petrin. Dalbert Batman, M.D., FACS  Assistant:                      OR staff  Operative Indications:    This is a very pleasant 72 year old woman. She is referred by Dr. 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 She was evaluated in the [facility]  by Dr. Lindi Adie, Dr. Isidore Moos, and me. Dr. Deforest Hoyles is her PCP. Dr. Raphael Gibney is her gynecologist.        Significant past history is left modified radical mastectomy age 69, 37. I was her Psychologist, sport and exercise. She was node negative but got chemotherapy. She subsequently had an implant placed by Dr. best on the left and a reduction mammoplasty on the right. Recent screening studies show a 4-5 mm mass in the right breast somewhere between the 9:00 and 11 o'clock position, middle depth. No other abnormalities were noted of the axilla looked fine. Image guided biopsy shows fragments of in situ and probably invasive cancer. Grade 2. Estrogen and progesterone receptors 100%. Ki-67 60% daily. HER-2 negative. She is motivated for breast conservation think she is a good candidate for that. She has been offered genetic counseling and she is interested in that as well but does not think that the result would affect her surgical preference. We talked about this for a long time.      Past history is significant for the left mastectomy, chemotherapy, left breast implant, right reduction mammoplasty. Non-insulin-dependent  diabetes mellitus. Family history significant that mother was diagnosed with breast cancer age 42 and had metastatic disease and succumbed to that. Brother also was diagnosed with breast cancer and was treated and is alive..      We talked about options for surgical management. We talked about mastectomy with or without reconstruction, sentinel node biopsy, lumpectomy and sentinel node biopsy. She wanted breast conservation. She is a good candidate for that She will be scheduled for right breast lumpectomy with radioactive seed localization and right axillary deep sentinel lymph node biopsy. . She agrees with this plan.     Following surgery she will likely be offered whole breast radiation therapy and antiestrogen therapy. She has been referred for genetic testing.    Operative Findings:       The primary cancer was in the lateral right breast.  It was relatively superficial and so a radial ellipse incision was made taking some skin anteriorly.  The specimen mammogram looks good with the seed and the marker clip in the center of the specimen.  In the axilla I found one hot node within some fatty tissue.  There was not much radioactivity or blue dye.  I theorized that the lymphatics may have been interrupted by the previous reduction mammoplasty.  There were no palpable abnormalities in the right axilla.  Procedure in Detail:          The patient underwent right pectoral block by the anesthesiologist.  She underwent injection of radionuclide by the nuclear medicine technician.  She underwent general anesthesia with LMA device.  Following surgical timeout and alcohol prep I injected 5 cc of blue dye into the right breast, lateral periareolar area, and massaged the breast for a few minutes.  Intravenous antibiotics were given.  The entire breast and axilla and chest wall were then prepped and draped in a sterile fashion.  0.5% Marcaine with epinephrine was used as a local infiltration  anesthetic. Using the neoprobe I found the radioactive seed laterally at about the 9:30 position.     I planned a transverse radial ellipse incision and that incision was made with a knife.  The lumpectomy was performed using the neoprobe and electrocautery.  The specimen was removed and marked with silk sutures and a 6 color ink kit to orient the pathologist.  Specimen mammogram looked good as described above.  The specimen was sent to the lab where the seed was retrieved.  Hemostasis was excellent and achieved with electrocautery.  The wound was irrigated.  I placed 5 metal marker clips in the walls of the lumpectomy cavity.  The breast tissues were reapproximated with multiple interrupted sutures of 3-0 Vicryl and the skin closed with a running subcuticular 4-0 Monocryl and Dermabond.       A transverse incision was made in the hairline of the right axilla.  Dissection was carried down through the clavipectoral fascia.  Using the neoprobe I attempted to map out some deep sentinel lymph nodes.  I found one hot area which was removed.  There was a little bit of radioactivity in a few areas but the counts were not that high.  I removed a few additional areas of the right axilla but was not sure that these were sentinel nodes.  There was no palpable abnormality.  I chose to complete the axillary surgery at this point.  Hemostasis was excellent.  The wound was irrigated.  The clavipectoral fascia was closed with 3-0 Vicryl sutures and the skin closed with a running subcuticular 4-0 Monocryl and Dermabond.  Clean bandages and a breast binder were placed.  The patient tolerated the procedure well and was taken to PACU in stable condition.  EBL 25 cc.  Counts correct.  Complications none.    Addendum: I logged onto the [employer] and reviewed her prescription medication history     Shalane Florendo M. Dalbert Batman, M.D., FACS General and Minimally Invasive Surgery Breast and Colorectal Surgery  08/18/2017 12:34  PM

## 2017-08-18 NOTE — Anesthesia Procedure Notes (Addendum)
Anesthesia Regional Block: Pectoralis block   Pre-Anesthetic Checklist: ,, timeout performed, Correct Patient, Correct Site, Correct Laterality, Correct Procedure,, site marked, risks and benefits discussed, Surgical consent,  Pre-op evaluation,  At surgeon's request and post-op pain management  Laterality: Right  Prep: chloraprep       Needles:  Injection technique: Single-shot  Needle Type: Echogenic Stimulator Needle     Needle Length: 10cm  Needle Gauge: 21     Additional Needles:   Procedures:,,,, ultrasound used (permanent image in chart),,,,  Narrative:  Start time: 08/18/2017 10:55 AM End time: 08/18/2017 11:05 AM Injection made incrementally with aspirations every 5 mL.  Performed by: Personally  Anesthesiologist: Murvin Natal, MD  Additional Notes: Functioning IV was confirmed and monitors were applied.  A 165mm 21ga Pajunk echogenic stimulator needle was used. Sterile prep, hand hygiene and sterile gloves were used.  Negative aspiration and negative test dose prior to incremental administration of local anesthetic. The patient tolerated the procedure well.

## 2017-08-18 NOTE — Discharge Instructions (Signed)
[place] [facility] Phone Number [phone]  BREAST BIOPSY/ LUMPECTOMY: POST OP INSTRUCTIONS  Always review your discharge instruction sheet given to you by the facility where your surgery was performed.  IF YOU HAVE DISABILITY OR FAMILY LEAVE FORMS, YOU MUST BRING THEM TO THE OFFICE FOR PROCESSING.  DO NOT GIVE THEM TO YOUR DOCTOR.  1. A prescription for pain medication may be given to you upon discharge.  Take your pain medication as prescribed, if needed.  If narcotic pain medicine is not needed, then you may take acetaminophen (Tylenol) or ibuprofen (Advil) as needed. **You had 1000 MG of Tylenol at 10:23 AM 2. Take your usually prescribed medications unless otherwise directed 3. If you need a refill on your pain medication, please contact your pharmacy.  They will contact our office to request authorization.  Prescriptions will not be filled after 5pm or on week-ends. 4. You should eat very light the first 24 hours after surgery, such as soup, crackers, pudding, etc.  Resume your normal diet the day after surgery. 5. Most patients will experience some swelling and bruising in the breast.  Ice packs and a good support bra will help.  Swelling and bruising can take several days to resolve.  6. It is common to experience some constipation if taking pain medication after surgery.  Increasing fluid intake and taking a stool softener will usually help or prevent this problem from occurring.  A mild laxative (Milk of Magnesia or Miralax) should be taken according to package directions if there are no bowel movements after 48 hours. 7. Unless discharge instructions indicate otherwise, you may remove your bandages 24-48 hours after surgery, and you may shower at that time.  You may have steri-strips (small skin tapes) in place directly over the incision.  These strips should be left on the skin for 7-10 days.  If your surgeon used skin glue on the incision, you may shower in 24 hours.   The glue will flake off over the next 2-3 weeks.  Any sutures or staples will be removed at the office during your follow-up visit. 8. ACTIVITIES:  You may resume regular daily activities (gradually increasing) beginning the next day.  Wearing a good support bra or sports bra minimizes pain and swelling.  You may have sexual intercourse when it is comfortable. a. You may drive when you no longer are taking prescription pain medication, you can comfortably wear a seatbelt, and you can safely maneuver your car and apply brakes. b. RETURN TO WORK:  ______________________________________________________________________________________ 9. You should see your doctor in the office for a follow-up appointment approximately two weeks after your surgery.  Your doctors nurse will typically make your follow-up appointment when she calls you with your pathology report.  Expect your pathology report 2-3 business days after your surgery.  You may call to check if you do not hear from [place] after three days. 10. OTHER INSTRUCTIONS: _______________________________________________________________________________________________ _____________________________________________________________________________________________________________________________________ _____________________________________________________________________________________________________________________________________ _____________________________________________________________________________________________________________________________________  WHEN TO CALL YOUR DOCTOR: 1. Fever over 101.0 2. Nausea and/or vomiting. 3. Extreme swelling or bruising. 4. Continued bleeding from incision. 5. Increased pain, redness, or drainage from the incision.  The clinic staff is available to answer your questions during regular business hours.  Please dont hesitate to call and ask to speak to one of the nurses for clinical concerns.  If you have a medical  emergency, go to the nearest emergency room or call 911.  A surgeon from [facility] is always on call at the hospital.  For further questions, please visit [email or web address]    Post Anesthesia Home Care Instructions  Activity: Get plenty of rest for the remainder of the day. A responsible individual must stay with you for 24 hours following the procedure.  For the next 24 hours, DO NOT: -Drive a car -Paediatric nurse -Drink alcoholic beverages -Take any medication unless instructed by your physician -Make any legal decisions or sign important papers.  Meals: Start with liquid foods such as gelatin or soup. Progress to regular foods as tolerated. Avoid greasy, spicy, heavy foods. If nausea and/or vomiting occur, drink only clear liquids until the nausea and/or vomiting subsides. Call your physician if vomiting continues.  Special Instructions/Symptoms: Your throat may feel dry or sore from the anesthesia or the breathing tube placed in your throat during surgery. If this causes discomfort, gargle with warm salt water. The discomfort should disappear within 24 hours.  If you had a scopolamine patch placed behind your ear for the management of post- operative nausea and/or vomiting:  1. The medication in the patch is effective for 72 hours, after which it should be removed.  Wrap patch in a tissue and discard in the trash. Wash hands thoroughly with soap and water. 2. You may remove the patch earlier than 72 hours if you experience unpleasant side effects which may include dry mouth, dizziness or visual disturbances. 3. Avoid touching the patch. Wash your hands with soap and water after contact with the patch.

## 2017-08-18 NOTE — Anesthesia Procedure Notes (Signed)
Procedure Name: LMA Insertion Date/Time: 08/18/2017 11:27 AM Performed by: Marrianne Mood, CRNA Pre-anesthesia Checklist: Patient identified, Emergency Drugs available, Suction available, Patient being monitored and Timeout performed Patient Re-evaluated:Patient Re-evaluated prior to induction Oxygen Delivery Method: Circle system utilized Preoxygenation: Pre-oxygenation with 100% oxygen Induction Type: IV induction Ventilation: Mask ventilation without difficulty LMA: LMA inserted LMA Size: 4.0 Number of attempts: 1 Airway Equipment and Method: Bite block Placement Confirmation: positive ETCO2 Tube secured with: Tape Dental Injury: Teeth and Oropharynx as per pre-operative assessment

## 2017-08-18 NOTE — Anesthesia Preprocedure Evaluation (Addendum)
Anesthesia Evaluation  Patient identified by MRN, date of birth, ID band Patient awake    Reviewed: Allergy & Precautions, NPO status , Patient's Chart, lab work & pertinent test results  Airway Mallampati: III  TM Distance: >3 FB Neck ROM: Full    Dental no notable dental hx.    Pulmonary neg pulmonary ROS,    Pulmonary exam normal breath sounds clear to auscultation       Cardiovascular hypertension, Pt. on medications Normal cardiovascular exam Rhythm:Regular Rate:Normal  ECG: NSR, rate 62   Neuro/Psych negative neurological ROS  negative psych ROS   GI/Hepatic negative GI ROS, Neg liver ROS,   Endo/Other  diabetes, Oral Hypoglycemic Agents  Renal/GU      Musculoskeletal negative musculoskeletal ROS (+)   Abdominal (+) + obese,   Peds  Hematology HLD   Anesthesia Other Findings breast cancer  Reproductive/Obstetrics                            Anesthesia Physical Anesthesia Plan  ASA: III  Anesthesia Plan: General   Post-op Pain Management: GA combined w/ Regional for post-op pain   Induction: Intravenous  PONV Risk Score and Plan: 3 and Midazolam, Dexamethasone, Ondansetron and Treatment may vary due to age or medical condition  Airway Management Planned: LMA  Additional Equipment:   Intra-op Plan:   Post-operative Plan: Extubation in OR  Informed Consent: I have reviewed the patients History and Physical, chart, labs and discussed the procedure including the risks, benefits and alternatives for the proposed anesthesia with the patient or authorized representative who has indicated his/her understanding and acceptance.   Dental advisory given  Plan Discussed with: CRNA  Anesthesia Plan Comments:         Anesthesia Quick Evaluation

## 2017-08-18 NOTE — Interval H&P Note (Signed)
History and Physical Interval Note:  08/18/2017 9:25 AM  Ana Coffey  has presented today for surgery, with the diagnosis of breast cancer  The various methods of treatment have been discussed with the patient and family. After consideration of risks, benefits and other options for treatment, the patient has consented to  Procedure(s): BREAST LUMPECTOMY WITH RADIOACTIVE SEED AND DEEP AXILLARY SENTINEL LYMPH NODE BIOPSY WITH BLUE DYE INJECTION (Right) as a surgical intervention .  The patient's history has been reviewed, patient examined, no change in status, stable for surgery.  I have reviewed the patient's chart and labs.  Questions were answered to the patient's satisfaction.     Adin Hector

## 2017-08-19 ENCOUNTER — Encounter (HOSPITAL_BASED_OUTPATIENT_CLINIC_OR_DEPARTMENT_OTHER): Payer: Self-pay | Admitting: General Surgery

## 2017-08-20 NOTE — Progress Notes (Signed)
Inform patient of Pathology report,. 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  3 mm diameter.  There is no cancer in any of the lymph nodes.  I will discuss this with her in detail at the next office visit. That me know that you are able to reach her  hmi

## 2017-08-30 ENCOUNTER — Inpatient Hospital Stay: Payer: Medicare Other | Attending: Hematology and Oncology | Admitting: Hematology and Oncology

## 2017-08-30 DIAGNOSIS — Z170 Estrogen receptor positive status [ER+]: Secondary | ICD-10-CM | POA: Insufficient documentation

## 2017-08-30 DIAGNOSIS — Z79811 Long term (current) use of aromatase inhibitors: Secondary | ICD-10-CM | POA: Diagnosis not present

## 2017-08-30 DIAGNOSIS — Z9012 Acquired absence of left breast and nipple: Secondary | ICD-10-CM | POA: Insufficient documentation

## 2017-08-30 DIAGNOSIS — C50411 Malignant neoplasm of upper-outer quadrant of right female breast: Secondary | ICD-10-CM | POA: Diagnosis not present

## 2017-08-30 MED ORDER — VENLAFAXINE HCL ER 37.5 MG PO CP24: 37.5000 mg | ORAL_CAPSULE | Freq: Every day | ORAL | 6 refills | Status: DC

## 2017-08-30 NOTE — Assessment & Plan Note (Signed)
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  Based on the biopsy, 0.3 cm IDC grade 2 ER 100%, PR 100%, HER-2 negative ratio 1.72, Ki-67 60%.  T1 a N0 stage IA  1986: Left breast cancer treated with mastectomy and reconstruction followed by she is in excellent physical health.  Adjuvant chemotherapy and antiestrogen therapy with tamoxifen for 2 to 3 years  Pathology counseling: I discussed the final pathology report of the patient provided  a copy of this report. I discussed the margins as well as lymph node surgeries. We also discussed the final staging along with previously performed ER/PR and HER-2/neu testing.  Recommendation: 1. Adjuvant radiation 2. followed by adjuvant antiestrogen therapy with letrozole 2.5 mg daily x5 years   Return to clinic after radiation therapy for follow-up

## 2017-08-30 NOTE — Progress Notes (Signed)
Patient Care Team: Marius Ditch, MD as PCP - General (Internal Medicine) Fanny Skates, MD as Consulting Physician (General Surgery) Nicholas Lose, MD as Consulting Physician (Hematology and Oncology) Eppie Gibson, MD as Attending Physician (Radiation Oncology)  DIAGNOSIS:  Encounter Diagnosis  Name Primary?  . Malignant neoplasm of upper-outer quadrant of right breast in female, estrogen receptor positive ([place])     SUMMARY OF ONCOLOGIC HISTORY: Oncology History   Declined Genetic testing on Aug 06, 2017     Breast cancer, left breast ([place])   1986 Initial Diagnosis    Breast cancer, left breast treated with mastectomy and reconstruction followed by adjuvant chemotherapy that incorporated Vincristine followed by tamoxifen for 2 to 3 years       Malignant neoplasm of upper-outer quadrant of right breast in female, estrogen receptor positive ([place])   07/27/2017 Initial Diagnosis    Screening detected asymmetry in the left breast 0.4 cm at 9 o'clock position 8 cm from nipple axilla negative: Biopsy revealed DCIS with probable invasive carcinoma grade 2, ER 100%, PR 100%, HER-2 negative ratio 1.72, Ki-67 60%, T1 a N0 stage I a clinical stage AJCC 8      08/18/2017 Surgery    Right 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  Based on the biopsy, 0.3 cm IDC grade 2 ER 100%, PR 100%, HER-2 negative ratio 1.72, Ki-67 60%.  T1 a N0 stage IA        CHIEF COMPLIANT: Follow-up to discuss the final pathology report  INTERVAL HISTORY: Ana Coffey is a 72 year old with above-mentioned history of newly diagnosed right breast cancer that was treated with lumpectomy and is here today to discuss the pathology report.  She had a very small focus of invasive ductal carcinoma that was only detected on the biopsy.  The final pathology did not have any invasive disease.  She is here today to discuss the final  pathology report.  She appears to be healing very well from the recent surgery.  REVIEW OF SYSTEMS:   Constitutional: Denies fevers, chills or abnormal weight loss Eyes: Denies blurriness of vision Ears, nose, mouth, throat, and face: Denies mucositis or sore throat Respiratory: Denies cough, dyspnea or wheezes Cardiovascular: Denies palpitation, chest discomfort Gastrointestinal:  Denies nausea, heartburn or change in bowel habits Skin: Denies abnormal skin rashes Lymphatics: Denies new lymphadenopathy or easy bruising Neurological:Denies numbness, tingling or new weaknesses Behavioral/Psych: Mood is stable, no new changes  Extremities: No lower extremity edema Breast:  denies any pain or lumps or nodules in either breasts All other systems were reviewed with the patient and are negative.  I have reviewed the past medical history, past surgical history, social history and family history with the patient and they are unchanged from previous note.  ALLERGIES:  has No Known Allergies.  MEDICATIONS:  Current Outpatient Medications  Medication Sig Dispense Refill  . Cholecalciferol (VITAMIN D3) 1000 units CAPS Take by mouth daily.    . Cinnamon 500 MG TABS Take by mouth.    . hydrochlorothiazide (HYDRODIURIL) 25 MG tablet Take 25 mg by mouth daily.    . metFORMIN (GLUCOPHAGE) 500 MG tablet   3  . multivitamin-iron-minerals-folic acid (CENTRUM) chewable tablet Chew 1 tablet by mouth daily.    Marland Kitchen OVER THE COUNTER MEDICATION Magnesium 250 mg daily per patient    . simvastatin (ZOCOR) 10 MG tablet   3  . traMADol (ULTRAM) 50 MG tablet  Take 1 tablet (50 mg total) by mouth every 6 (six) hours as needed. 20 tablet 0  . Turmeric Curcumin 500 MG CAPS Take 500 mg by mouth. 2 per day    . venlafaxine XR (EFFEXOR-XR) 37.5 MG 24 hr capsule Take 1 capsule (37.5 mg total) by mouth daily with breakfast. 30 capsule 6  . vitamin B-12 (CYANOCOBALAMIN) 1000 MCG tablet Take 5,000 mcg by mouth.    . zinc  gluconate 50 MG tablet Take 50 mg by mouth daily.     No current facility-administered medications for this visit.     PHYSICAL EXAMINATION: ECOG PERFORMANCE STATUS: 1 - Symptomatic but completely ambulatory  Vitals:   08/30/17 1108  BP: (!) 158/86  Pulse: 70  Resp: 18  Temp: 98.7 F (37.1 C)  SpO2: 100%   Filed Weights   08/30/17 1108  Weight: 179 lb 3.2 oz (81.3 kg)    GENERAL:alert, no distress and comfortable SKIN: skin color, texture, turgor are normal, no rashes or significant lesions EYES: normal, Conjunctiva are pink and non-injected, sclera clear OROPHARYNX:no exudate, no erythema and lips, buccal mucosa, and tongue normal  NECK: supple, thyroid normal size, non-tender, without nodularity LYMPH:  no palpable lymphadenopathy in the cervical, axillary or inguinal LUNGS: clear to auscultation and percussion with normal breathing effort HEART: regular rate & rhythm and no murmurs and no lower extremity edema ABDOMEN:abdomen soft, non-tender and normal bowel sounds MUSCULOSKELETAL:no cyanosis of digits and no clubbing  NEURO: alert & oriented x 3 with fluent speech, no focal motor/sensory deficits EXTREMITIES: No lower extremity edema  LABORATORY DATA:  I have reviewed the data as listed CMP Latest Ref Rng & Units 08/04/2017  Glucose 70 - 140 mg/dL 109  BUN 7 - 26 mg/dL 16  Creatinine 0.60 - 1.10 mg/dL 0.94  Sodium 136 - 145 mmol/L 141  Potassium 3.5 - 5.1 mmol/L 4.0  Chloride 98 - 109 mmol/L 104  CO2 22 - 29 mmol/L 30(H)  Calcium 8.4 - 10.4 mg/dL 9.6  Total Protein 6.4 - 8.3 g/dL 7.0  Total Bilirubin 0.2 - 1.2 mg/dL 0.4  Alkaline Phos 40 - 150 U/L 70  AST 5 - 34 U/L 25  ALT 0 - 55 U/L 25    Lab Results  Component Value Date   WBC 3.5 (L) 08/04/2017   HGB 14.1 08/04/2017   HCT 42.9 08/04/2017   MCV 87.4 08/04/2017   PLT 195 08/04/2017   NEUTROABS 2.0 08/04/2017    ASSESSMENT & PLAN:  Malignant neoplasm of upper-outer quadrant of right breast in  female, estrogen receptor positive ([place]) 08/18/2017:Right 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  Based on the biopsy, 0.3 cm IDC grade 2 ER 100%, PR 100%, HER-2 negative ratio 1.72, Ki-67 60%.  T1 a N0 stage IA  1986: Left breast cancer treated with mastectomy and reconstruction followed by she is in excellent physical health.  Adjuvant chemotherapy and antiestrogen therapy with tamoxifen for 2 to 3 years  Pathology counseling: I discussed the final pathology report of the patient provided  a copy of this report. I discussed the margins as well as lymph node surgeries. We also discussed the final staging along with previously performed ER/PR and HER-2/neu testing.  Recommendation: 1. Adjuvant radiation 2. followed by adjuvant antiestrogen therapy with letrozole 2.5 mg daily x5 years   Return to clinic after radiation therapy for follow-up    No orders of the defined  types were placed in this encounter.  The patient has a good understanding of the overall plan. she agrees with it. she will call with any problems that may develop before the next visit here.   Harriette Ohara, MD 08/30/17

## 2017-08-31 ENCOUNTER — Telehealth: Payer: Self-pay | Admitting: Hematology and Oncology

## 2017-08-31 ENCOUNTER — Encounter: Payer: Self-pay | Admitting: Radiation Oncology

## 2017-08-31 NOTE — Progress Notes (Signed)
Location of Breast Cancer: Right Breast  Histology per Pathology Report:  07/27/17 Diagnosis Breast, right, needle core biopsy, 9 o'clock, 8cmfn - ATYPICAL FRAGMENTS CONSISTENT WITH DUCTAL CARCINOMA.  Receptor Status: ER(100%), PR (100%), Her2-neu (NEG), Ki-(60%)  08/18/17 Diagnosis 1. Breast, lumpectomy, Right - DUCTAL CARCINOMA IN SITU, HIGH GRADE, SPANNING 0.3 CM. - CARCINOMA IS FOCALLY 0.1 CM TO THE POSTERIOR MARGIN. - SEE ONCOLOGY TABLE BELOW. 2. Lymph node, sentinel, biopsy, Right axilla - THERE IS NO EVIDENCE OF CARCINOMA IN 1 OF 1 LYMPH NODE (0/1). 3. Lymph nodes, regional resection, Additional right axillary contents - THERE IS NO EVIDENCE OF CARCINOMA IN 1 OF 1 LYMPH NODE (0/1).  Did patient present with symptoms or was this found on screening mammography?: It was found on a screening mammogram.   Past/Anticipated interventions by surgeon, if any: 08/18/17  Procedure:                 Inject blue dye right breast                                     Right breast lumpectomy with radioactive seed localization                                       Right axillary deep sentinel lymph node biopsy Surgeon:                     Edsel Petrin. Dalbert Batman, M.D., [facility]   Past/Anticipated interventions by medical oncology, if any:  08/30/17 Dr. Lindi Adie Recommendation: 1. Adjuvant radiation 2. followed by adjuvant antiestrogen therapy with letrozole 2.5 mg daily x5 years  -Return to clinic after radiation therapy for follow-up  Lymphedema issues, if any: No  Pain issues, if any:  Pt c/o pain in axilla radiating to mid upper arm, medial. Pt has good relief from PRN analgesics.   SAFETY ISSUES:  Prior radiation? No  Pacemaker/ICD? No  Possible current pregnancy? No  Is the patient on methotrexate? No  Current Complaints / other details:   Breast cancer, left breast ([place])   1986 Initial Diagnosis    Breast cancer, left breast treated with mastectomy and reconstruction followed by  adjuvant chemotherapy that incorporated Vincristine followed by tamoxifen for 2 to 3 years   BP (!) 166/95   Pulse 67   Temp 97.8 F (36.6 C)   Resp 18   Ht '5\' 3"'  (1.6 m)   Wt 180 lb 3.2 oz (81.7 kg)   SpO2 100%   BMI 31.92 kg/m   Wt Readings from Last 3 Encounters:  09/08/17 180 lb 3.2 oz (81.7 kg)  08/30/17 179 lb 3.2 oz (81.3 kg)  08/18/17 179 lb (81.2 kg)   Pt here today for consult with Dr. Isidore Moos for Radiation Oncology. She is accompanied by husband. Pt has distress screening of 10 but states it is from being "here" at [facility]. Will refer to SW.      [place], Stephani Police, RN 08/31/2017,10:08 AM

## 2017-08-31 NOTE — Telephone Encounter (Signed)
Per 6/10 no los °

## 2017-09-01 ENCOUNTER — Institutional Professional Consult (permissible substitution): Payer: Medicare Other | Admitting: Radiation Oncology

## 2017-09-08 ENCOUNTER — Ambulatory Visit
Admission: RE | Admit: 2017-09-08 | Discharge: 2017-09-08 | Disposition: A | Discharge status: Home / Self Care | Payer: Medicare Other | Source: Ambulatory Visit | Attending: Radiation Oncology | Admitting: Radiation Oncology

## 2017-09-08 ENCOUNTER — Other Ambulatory Visit: Payer: Self-pay

## 2017-09-08 ENCOUNTER — Encounter: Payer: Self-pay | Admitting: Radiation Oncology

## 2017-09-08 VITALS — BP 166/95 | HR 67 | Temp 97.8°F | Resp 18 | Ht 63.0 in | Wt 180.2 lb

## 2017-09-08 DIAGNOSIS — Z170 Estrogen receptor positive status [ER+]: Secondary | ICD-10-CM | POA: Diagnosis not present

## 2017-09-08 DIAGNOSIS — C50411 Malignant neoplasm of upper-outer quadrant of right female breast: Secondary | ICD-10-CM | POA: Diagnosis not present

## 2017-09-08 DIAGNOSIS — Z7984 Long term (current) use of oral hypoglycemic drugs: Secondary | ICD-10-CM | POA: Insufficient documentation

## 2017-09-08 DIAGNOSIS — Z79899 Other long term (current) drug therapy: Secondary | ICD-10-CM | POA: Diagnosis not present

## 2017-09-08 DIAGNOSIS — Z9889 Other specified postprocedural states: Secondary | ICD-10-CM | POA: Diagnosis not present

## 2017-09-08 NOTE — Progress Notes (Signed)
Radiation Oncology         [phone] ________________________________  Name: Ana Coffey MRN: [id number]  Date: 09/08/2017  DOB: Dec 14, 1945  Follow-Up Visit Note  Outpatient  CC: Marius Ditch, MD  Marius Ditch, MD  Diagnosis:      ICD-10-CM   1. Malignant neoplasm of upper-outer quadrant of right breast in female, estrogen receptor positive ([place]) C50.411 Ambulatory referral to Social Work   Z17.0   Cancer Staging Malignant neoplasm of upper-outer quadrant of right breast in female, estrogen receptor positive ([place]) Staging form: Breast, AJCC 8th Edition - Clinical stage from 08/04/2017: Stage IA (cT1a, cN0, cM0, G2, ER+, PR+, HER2-) - Unsigned Pathologic stage T1aN0M0  CHIEF COMPLAINT: Here to discuss management of right breast cancer  Narrative:  The patient returns today for follow-up. She was seen in breast clinic on 08/04/2017.   Breast/nodal surgery on date of 08/18/2017 revealed: tumor size of 0.3 cm; histology of ductal carcinoma (high grade); margin status to invasive disease of greater than 0.2 cm in all directions; margin status to in situ disease of 0.1 cm to posterior margin; nodal status of 2 negative sentinel nodes;  ER status: 100%; PR status 100%, Her2 status negative (from previous biopsy).   Essentially, no residual invasive cancer was in lumpectomy.  She will start antiestrogens after RT  Symptomatically, the patient reports: post op axillary pain/arm pain        ALLERGIES:  has No Known Allergies.  Meds: Current Outpatient Medications  Medication Sig Dispense Refill  . Cholecalciferol (VITAMIN D3) 1000 units CAPS Take by mouth daily.    . Cinnamon 500 MG TABS Take by mouth.    . hydrochlorothiazide (HYDRODIURIL) 25 MG tablet Take 25 mg by mouth daily.    . metFORMIN (GLUCOPHAGE) 500 MG tablet   3  . multivitamin-iron-minerals-folic acid (CENTRUM) chewable tablet Chew 1 tablet by mouth daily.    Marland Kitchen OVER THE COUNTER MEDICATION Magnesium 250 mg  daily per patient    . simvastatin (ZOCOR) 10 MG tablet   3  . traMADol (ULTRAM) 50 MG tablet Take 1 tablet (50 mg total) by mouth every 6 (six) hours as needed. 20 tablet 0  . Turmeric Curcumin 500 MG CAPS Take 500 mg by mouth. 2 per day    . vitamin B-12 (CYANOCOBALAMIN) 1000 MCG tablet Take 5,000 mcg by mouth.    . zinc gluconate 50 MG tablet Take 50 mg by mouth daily.    Marland Kitchen venlafaxine XR (EFFEXOR-XR) 37.5 MG 24 hr capsule Take 1 capsule (37.5 mg total) by mouth daily with breakfast. (Patient not taking: Reported on 09/08/2017) 30 capsule 6   No current facility-administered medications for this encounter.     Physical Findings:  height is '5\' 3"'  (1.6 m) and weight is 180 lb 3.2 oz (81.7 kg). Her temperature is 97.8 F (36.6 C). Her blood pressure is 166/95 (abnormal) and her pulse is 67. Her respiration is 18 and oxygen saturation is 100%. .     General: Alert and oriented, in no acute distress Heart: Regular in rate and rhythm with no murmurs, rubs, or gallops. Chest: Clear to auscultation bilaterally, with no rhonchi, wheezes, or rales. Musculoskeletal: symmetric strength and muscle tone throughout. Good range of motion in her shoulders. Breast exam reveals right breast has a healing axillary and lateral lumpectomy scar. There are some post-operative seromas under both scars. No active drainage.  Lab Findings: Lab Results  Component Value Date   WBC 3.5 (L)  08/04/2017   HGB 14.1 08/04/2017   HCT 42.9 08/04/2017   MCV 87.4 08/04/2017   PLT 195 08/04/2017    '@LASTCHEMISTRY' @  Radiographic Findings: Nm Sentinel Node Inj-no Rpt (breast)  Result Date: 08/18/2017 Sulfur colloid was injected by the nuclear medicine technologist for melanoma sentinel node.    Impression/Plan: Right Breast Stage IA ductal carcinoma (patient understands it is possible she had DCIS all along, as biopsy was questionable for invasive disease, but we are conservatively staging this as IA disease)  We  will schedule simulation in 1-2 weeks.  We discussed adjuvant radiotherapy today.  I recommend treating the right breast  in order to reduce risk of local recurrence by 2/3.  The risks, benefits and side effects of this treatment were discussed in detail.  She understands that radiotherapy is associated with skin irritation and fatigue in the acute setting. Late effects can include cosmetic changes and rare injury to internal organs.   She is enthusiastic about proceeding with treatment. A consent form has been signed and placed in her chart.  A total of 3 medically necessary complex treatment devices will be fabricated and supervised by me: 2 fields with MLCs for custom blocks to protect heart, and lungs;  and, a Vac-lok. MORE COMPLEX DEVICES MAY BE MADE IN DOSIMETRY FOR FIELD IN FIELD BEAMS FOR DOSE HOMOGENEITY.  I have requested : 3D Simulation which is medically necessary to give adequate dose to at risk tissues while sparing lungs and heart.  I have requested a DVH of the following structures: lungs, heart, right lumpectomy cavity.    The patient will receive 40.05 Gy in 15 fractions to the right breast with 2 fields.  This will be followed by a boost.  I spent 25 minutes  face to face with the patient and more than 50% of that time was spent in counseling and/or coordination of care. _____________________________________   Eppie Gibson, MD

## 2017-09-09 ENCOUNTER — Encounter: Payer: Self-pay | Admitting: General Practice

## 2017-09-09 NOTE — Progress Notes (Signed)
[facility] Psychosocial Distress Screening Clinical Social Work  Clinical Social Work was referred by distress screening protocol.  The patient scored a 10 on the Psychosocial Distress Thermometer which indicates severe distress. Clinical Social Worker contacted patient by phone to assess for distress and other psychosocial needs. Unable to reach patient, left VM requesting call back.    ONCBCN DISTRESS SCREENING 09/08/2017  Screening Type Initial Screening  Distress experienced in past week (1-10) 10  Family Problem type Other (comment)  Emotional problem type Nervousness/Anxiety  Information Concerns Type   Physical Problem type Pain  Referral to clinical social work Yes  Referral to support programs     Clinical Social Worker follow up needed: Yes.    If yes, follow up plan:  recontact  Beverely Pace, [place], [place] Worker Phone:  [phone]

## 2017-09-16 ENCOUNTER — Encounter: Payer: Self-pay | Admitting: Physical Therapy

## 2017-09-16 ENCOUNTER — Ambulatory Visit: Payer: Medicare Other | Attending: General Surgery | Admitting: Physical Therapy

## 2017-09-16 ENCOUNTER — Other Ambulatory Visit: Payer: Self-pay

## 2017-09-16 DIAGNOSIS — C50411 Malignant neoplasm of upper-outer quadrant of right female breast: Secondary | ICD-10-CM | POA: Insufficient documentation

## 2017-09-16 DIAGNOSIS — R293 Abnormal posture: Secondary | ICD-10-CM | POA: Insufficient documentation

## 2017-09-16 DIAGNOSIS — Z170 Estrogen receptor positive status [ER+]: Secondary | ICD-10-CM | POA: Diagnosis not present

## 2017-09-16 DIAGNOSIS — Z483 Aftercare following surgery for neoplasm: Secondary | ICD-10-CM | POA: Diagnosis not present

## 2017-09-16 NOTE — Therapy (Signed)
[place], [place], [address] Phone: [phone]   Fax:  [phone]  Physical Therapy Treatment  Patient Details  Name: Ana Coffey MRN: [id number] Date of Birth: [birth year] Referring Provider: Dr. Fanny Skates   Encounter Date: 09/16/2017  PT End of Session - 09/16/17 1148    Visit Number  2    Number of Visits  2    PT Start Time  6924    PT Stop Time  1150    PT Time Calculation (min)  45 min    Activity Tolerance  Patient tolerated treatment well    Behavior During Therapy  [facility] for tasks assessed/performed       Past Medical History:  Diagnosis Date  . Allergy   . Breast cancer, left breast ([place]) 1986  . Candida vaginitis   . Diabetes mellitus without complication ([place])   . Fibroids   . Hypertension   . Menopausal symptoms   . Pelvic relaxation   . Postmenopausal bleeding     Past Surgical History:  Procedure Laterality Date  . BREAST LUMPECTOMY WITH RADIOACTIVE SEED AND SENTINEL LYMPH NODE BIOPSY Right 08/18/2017   Procedure: BREAST LUMPECTOMY WITH RADIOACTIVE SEED AND DEEP AXILLARY SENTINEL LYMPH NODE BIOPSY WITH BLUE DYE INJECTION;  Surgeon: Fanny Skates, MD;  Location: [facility];  Service: General;  Laterality: Right;  . MASTECTOMY  1986   left breast    There were no vitals filed for this visit.  Subjective Assessment - 09/16/17 1110    Subjective  Patient underwent a right lumpectomy and sentinel node biopsy (0/2 nodes positive) on 08/18/17. 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 She will undergo radiation in July and anti-estrogen therapy. Her only complaint is right upper arm burning. Initially her biopsy pathology report suspected invasive cancer but it was not.    Pertinent History  Patient underwent a right lumpectomy and sentinel node biopsy (0/2 nodes positive) on 08/18/17. 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 She will undergo radiation in July and anti-estrogen therapy. Her  only complaint is right upper arm burning. Left mastectomy with 10 nodes removed on the left in 1986. She also underwent chemo at that time.    Patient Stated Goals  Get rid of arm pain    Currently in Pain?  Yes    Pain Score  7     Pain Location  Arm    Pain Orientation  Upper;Medial    Pain Descriptors / Indicators  Burning    Pain Type  Neuropathic pain    Pain Radiating Towards  elbow    Pain Onset  1 to 4 weeks ago    Pain Frequency  Intermittent    Aggravating Factors   Night time    Pain Relieving Factors  Daytime         [facility] PT Assessment - 09/16/17 0001      Assessment   Medical Diagnosis  s/p right lumpectomy and SLNB    Referring Provider  Dr. Fanny Skates    Onset Date/Surgical Date  08/18/17    Hand Dominance  Right    Prior Therapy  Baselines      Precautions   Precautions  Other (comment)    Precaution Comments  BUE lymphedema risk      Restrictions   Weight Bearing Restrictions  No      Balance Screen   Has the patient fallen in the past 6 months  No  Has the patient had a decrease in activity level because of a fear of falling?   No    Is the patient reluctant to leave their home because of a fear of falling?   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 there is some bulkiness due to scar tissue on her breast (inferior) incision.      Posture/Postural Control   Posture/Postural Control  Postural limitations    Postural Limitations  Rounded Shoulders;Forward head      ROM /  Strength   AROM / PROM / Strength  AROM      AROM   AROM Assessment Site  Shoulder    Right/Left Shoulder  Right    Right Shoulder Extension  41 Degrees    Right Shoulder Flexion  139 Degrees    Right Shoulder ABduction  149 Degrees    Right Shoulder Internal Rotation  60 Degrees    Right Shoulder External Rotation  68 Degrees        LYMPHEDEMA/ONCOLOGY QUESTIONNAIRE - 09/16/17 1119      Type   Cancer Type  s/p right lumpectomy and SLNB      Surgeries   Lumpectomy Date  08/18/17    Sentinel Lymph Node Biopsy Date  08/18/17    Number Lymph Nodes Removed  2      Treatment   Active Chemotherapy Treatment  No    Past Chemotherapy Treatment  No    Active Radiation Treatment  No    Past Radiation Treatment  No    Current Hormone Treatment  No    Past Hormone Therapy  No      What other symptoms do you have   Are you Having Heaviness or Tightness  No    Are you having Pain  Yes    Are you having pitting edema  No    Is it Hard or Difficult finding clothes that fit  No    Do you have infections  No    Is there Decreased scar mobility  No    [place] Sign  No      Lymphedema Assessments   Lymphedema Assessments  Upper extremities      Right Upper Extremity Lymphedema   10 cm Proximal to Olecranon Process  30.2 cm    Olecranon Process  26.3 cm    10 cm Proximal to Ulnar Styloid Process  23.3 cm    Just Proximal to Ulnar Styloid Process  16 cm    Across Hand at [employer]  19.1 cm    At [place] of 2nd Digit  6.3 cm      Left Upper Extremity Lymphedema   10 cm Proximal to Olecranon Process  29.9 cm    Olecranon Process  25.4 cm    10 cm Proximal to Ulnar Styloid Process  22.2 cm    Just Proximal to Ulnar Styloid  Process  16.1 cm    Across Hand at [employer]  18.8 cm    At [place] of 2nd Digit  6.3 cm        Quick Dash - 09/16/17 0001    Open a tight or new jar  No difficulty    Do heavy household chores (wash walls, wash floors)  No difficulty    Carry a  shopping bag or briefcase  No difficulty    Wash your back  No difficulty    Use a knife to cut food  No difficulty    Recreational activities in which you take some force or impact through your arm, shoulder, or hand (golf, hammering, tennis)  No difficulty    During the past week, to what extent has your arm, shoulder or hand problem interfered with your normal social activities with family, friends, neighbors, or groups?  Not at all    During the past week, to what extent has your arm, shoulder or hand problem limited your work or other regular daily activities  Not at all    Arm, shoulder, or hand pain.  Moderate    Tingling (pins and needles) in your arm, shoulder, or hand  Moderate    Difficulty Sleeping  Mild difficulty    DASH Score  11.36 %                     PT Education - 09/16/17 1136    Education provided  Yes    Education Details  Nerve stretch right arm and use of coconut oil on scar to reduce appearance     Person(s) Educated  Patient;Spouse    Methods  Explanation;Demonstration;Handout    Comprehension  Returned demonstration;Verbalized understanding          PT Long Term Goals - 09/16/17 1155      PT LONG TERM GOAL #1   Title  Patient will demonstrate she has regained full shoulder ROM and function post operatively compared with baseline measurements.    Time  8    Period  Weeks    Status  Achieved      Breast Clinic Goals - 08/04/17 1904      Patient will be able to verbalize understanding of pertinent lymphedema risk reduction practices relevant to her diagnosis specifically related to skin care.   Time  1    Period  Days    Status  Achieved      Patient will be able to return demonstrate and/or verbalize understanding of the post-op home exercise program related to regaining shoulder range of motion.   Time  1    Period  Days    Status  Achieved      Patient will be able to verbalize understanding of the importance of attending the  postoperative After Breast Cancer Class for further lymphedema risk reduction education and therapeutic exercise.   Time  1    Period  Days    Status  Achieved           Plan - 09/16/17 1148    Clinical Impression Statement  Patient is doing very well s/p right lumpectomy and SLNB. She will begin radiation in about 2 weeks and her ROM is within 5 degrees of baseline. Her scar tissue feels somewhat bulky but she was educated on ways to reduce that and reproted she felt she could work on that at home. She was encouraged to attend the After Breast Cancer class  to educate herself on risk reduction for lymphedema. Otherwise, no PT needs at this time.    PT Treatment/Interventions  ADLs/Self Care Home Management;Therapeutic exercise;Patient/family education    PT Next Visit Plan  D/C    PT Home Exercise Plan  post op shoulder ROM HEP and neural stretch    Consulted and Agree with Plan of Care  Patient;Family member/caregiver    Family Member Consulted  Husband       Patient will benefit from skilled therapeutic intervention in order to improve the following deficits and impairments:  Decreased knowledge of precautions, Impaired UE functional use, Decreased range of motion, Postural dysfunction, Pain  Visit Diagnosis: Malignant neoplasm of upper-outer quadrant of right breast in female, estrogen receptor positive ([place])  Abnormal posture  Aftercare following surgery for neoplasm     Problem List Patient Active Problem List   Diagnosis Date Noted  . Genetic testing 08/06/2017  . Malignant neoplasm of upper-outer quadrant of right breast in female, estrogen receptor positive ([place]) 07/30/2017  . Obesity 08/04/2011  . Decreased libido 06/17/2010    Class: History of  . Pelvic relaxation 04/07/2000    Class: History of  . Menopausal symptom 04/16/1992    Class: History of  . Breast cancer, left breast ([place]) 03/05/1986    Class: History of   PHYSICAL THERAPY DISCHARGE  SUMMARY  Visits from Start of Care: 2  Current functional level related to goals / functional outcomes: Goals met.    Remaining deficits: Some scar tissue tightness and lacking 5 degrees of shoulder flexion and abduction compared to baseline.   Education / Equipment: Lymphedema risk reduction and HEP Plan: Patient agrees to discharge.  Patient goals were met. Patient is being discharged due to meeting the stated rehab goals.  ?????        Annia Friendly, [place] 09/16/17 11:57 AM    [place], [place], [address] Phone: [phone]   Fax:  [phone]  Name: MACHELLE RAYBON MRN: [id number] Date of Birth: 11/04/1945

## 2017-09-16 NOTE — Patient Instructions (Signed)
MEDIAN NERVE: Mobilization XI    Stand with right palm flat on wall, fingers back, elbow bent, head tilted away. Sidestep away from wall, straightening elbow. Do ___ sets of ___ repetitions per session. Do ___ sessions per day.  Copyright  VHI. All rights reserved.

## 2017-09-20 ENCOUNTER — Ambulatory Visit
Admission: RE | Admit: 2017-09-20 | Discharge: 2017-09-20 | Disposition: A | Discharge status: Home / Self Care | Payer: Medicare Other | Source: Ambulatory Visit | Attending: Radiation Oncology | Admitting: Radiation Oncology

## 2017-09-20 DIAGNOSIS — Z170 Estrogen receptor positive status [ER+]: Secondary | ICD-10-CM | POA: Insufficient documentation

## 2017-09-20 DIAGNOSIS — C50411 Malignant neoplasm of upper-outer quadrant of right female breast: Secondary | ICD-10-CM

## 2017-09-20 DIAGNOSIS — Z510 Encounter for antineoplastic radiation therapy: Secondary | ICD-10-CM | POA: Diagnosis not present

## 2017-09-20 NOTE — Progress Notes (Addendum)
  Radiation Oncology         [phone]) [phone] ________________________________  Name: JATZIRI GOFFREDO MRN: [id number]  Date: 09/20/2017  DOB: [birth year]  SIMULATION AND TREATMENT PLANNING NOTE    Outpatient  DIAGNOSIS:     ICD-10-CM   1. Malignant neoplasm of upper-outer quadrant of right breast in female, estrogen receptor positive ([place]) C50.411    Z17.0     NARRATIVE:  The patient was brought to the [facility].  Identity was confirmed.  All relevant records and images related to the planned course of therapy were reviewed.  The patient freely provided informed written consent to proceed with treatment after reviewing the details related to the planned course of therapy. The consent form was witnessed and verified by the simulation staff.    Then, the patient was set-up in a stable reproducible supine position for radiation therapy with her ipsilateral arm over her head, and her upper body secured in a custom-made Vac-lok device.  CT images were obtained.  Surface markings were placed.  The CT images were loaded into the planning software.    TREATMENT PLANNING NOTE: Treatment planning then occurred.  The radiation prescription was entered and confirmed.     A total of 3 medically necessary complex treatment devices were fabricated and supervised by me: 2 fields with MLCs for custom blocks to protect heart, and lungs;  and, a Vac-lok. MORE COMPLEX DEVICES MAY BE MADE IN DOSIMETRY FOR FIELD IN FIELD BEAMS FOR DOSE HOMOGENEITY.  I have requested : 3D Simulation which is medically necessary to give adequate dose to at risk tissues while sparing lungs and heart. I have requested a DVH of the following structures: lungs, heart, right lumpectomy cavity.    The patient will receive 40.05 Gy in 15 fractions to the right breast with 2 tangential fields. This will be followed by a boost.  Optical Surface Tracking Plan:  Since intensity modulated radiotherapy (IMRT) and 3D conformal  radiation treatment methods are predicated on accurate and precise positioning for treatment, intrafraction motion monitoring is medically necessary to ensure accurate and safe treatment delivery. The ability to quantify intrafraction motion without excessive ionizing radiation dose can only be performed with optical surface tracking. Accordingly, surface imaging offers the opportunity to obtain 3D measurements of patient position throughout IMRT and 3D treatments without excessive radiation exposure. I am ordering optical surface tracking for this patient's upcoming course of radiotherapy.  ________________________________   Reference:  Ursula Alert, J, et al. Surface imaging-based analysis of intrafraction motion for breast radiotherapy patients.Journal of [place], n. 6, nov. 2014. ISSN [id number].  Available at: <http://www.jacmp.org/index.php/jacmp/article/view/4957>.    -----------------------------------  Eppie Gibson, MD

## 2017-09-21 ENCOUNTER — Encounter: Payer: Self-pay | Admitting: General Practice

## 2017-09-21 ENCOUNTER — Telehealth: Payer: Self-pay | Admitting: Hematology and Oncology

## 2017-09-21 NOTE — Progress Notes (Signed)
[facility] CSW Progress Note  Second attempt to reach patient to discuss Distress Screen, unable to reach patient.  Edwyna Shell, LCSW Clinical Social Worker Phone:  [phone]

## 2017-09-21 NOTE — Telephone Encounter (Signed)
Mailed patient calendar of upcoming July appt updates per 7/2 sch message

## 2017-09-22 DIAGNOSIS — Z170 Estrogen receptor positive status [ER+]: Secondary | ICD-10-CM | POA: Diagnosis not present

## 2017-09-22 DIAGNOSIS — C50412 Malignant neoplasm of upper-outer quadrant of left female breast: Secondary | ICD-10-CM | POA: Diagnosis not present

## 2017-09-22 DIAGNOSIS — Z510 Encounter for antineoplastic radiation therapy: Secondary | ICD-10-CM | POA: Diagnosis not present

## 2017-09-22 DIAGNOSIS — E1169 Type 2 diabetes mellitus with other specified complication: Secondary | ICD-10-CM | POA: Diagnosis not present

## 2017-09-22 DIAGNOSIS — Z7984 Long term (current) use of oral hypoglycemic drugs: Secondary | ICD-10-CM | POA: Diagnosis not present

## 2017-09-22 DIAGNOSIS — R232 Flushing: Secondary | ICD-10-CM | POA: Diagnosis not present

## 2017-09-22 DIAGNOSIS — C50411 Malignant neoplasm of upper-outer quadrant of right female breast: Secondary | ICD-10-CM | POA: Diagnosis not present

## 2017-09-22 DIAGNOSIS — C50912 Malignant neoplasm of unspecified site of left female breast: Secondary | ICD-10-CM | POA: Diagnosis not present

## 2017-09-22 DIAGNOSIS — I10 Essential (primary) hypertension: Secondary | ICD-10-CM | POA: Diagnosis not present

## 2017-09-22 DIAGNOSIS — E7800 Pure hypercholesterolemia, unspecified: Secondary | ICD-10-CM | POA: Diagnosis not present

## 2017-09-27 ENCOUNTER — Telehealth: Payer: Self-pay

## 2017-09-27 ENCOUNTER — Ambulatory Visit
Admission: RE | Admit: 2017-09-27 | Discharge: 2017-09-27 | Disposition: A | Discharge status: Home / Self Care | Payer: Medicare Other | Source: Ambulatory Visit | Attending: Radiation Oncology | Admitting: Radiation Oncology

## 2017-09-27 DIAGNOSIS — C50411 Malignant neoplasm of upper-outer quadrant of right female breast: Secondary | ICD-10-CM | POA: Diagnosis not present

## 2017-09-27 DIAGNOSIS — Z510 Encounter for antineoplastic radiation therapy: Secondary | ICD-10-CM | POA: Diagnosis not present

## 2017-09-27 DIAGNOSIS — Z170 Estrogen receptor positive status [ER+]: Secondary | ICD-10-CM | POA: Diagnosis not present

## 2017-09-27 NOTE — Telephone Encounter (Signed)
Returned patient's call regarding medication Effexor.  Patient stated, "Dr. Lindi Adie prescribed this medication back in June for me however I did not start taking until 4 days ago after seeing my primary doctor."  Patient c/o severe gas and stomach irritation after taking medications.  Patient stated, "I would rather deal with the hot flashes than take this medication."  Inform patient that since she has only taken medication for 4 days it is okay to stop, however monitor for possible side effects such as tachycardia, dizziness, and headaches.   Patient voiced understanding and agreement.  Patient will discontinue Effexor per her request.  Will forward to MD for review and any further possible recommendations.

## 2017-09-28 ENCOUNTER — Ambulatory Visit
Admission: RE | Admit: 2017-09-28 | Discharge: 2017-09-28 | Disposition: A | Discharge status: Home / Self Care | Payer: Medicare Other | Source: Ambulatory Visit | Attending: Radiation Oncology | Admitting: Radiation Oncology

## 2017-09-28 DIAGNOSIS — Z170 Estrogen receptor positive status [ER+]: Secondary | ICD-10-CM | POA: Diagnosis not present

## 2017-09-28 DIAGNOSIS — Z510 Encounter for antineoplastic radiation therapy: Secondary | ICD-10-CM | POA: Diagnosis not present

## 2017-09-28 DIAGNOSIS — C50411 Malignant neoplasm of upper-outer quadrant of right female breast: Secondary | ICD-10-CM | POA: Diagnosis not present

## 2017-09-28 MED ORDER — RADIAPLEXRX EX GEL
Freq: Once | CUTANEOUS | Status: AC
  Administered 2017-09-28: 13:00:00 via TOPICAL

## 2017-09-28 MED ORDER — ALRA NON-METALLIC DEODORANT (RAD-ONC)
1.0000 "application " | Freq: Once | TOPICAL | Status: AC
  Administered 2017-09-28: 1 via TOPICAL

## 2017-09-28 NOTE — Progress Notes (Signed)
Pt here for patient teaching.  Pt given Radiation and You booklet, skin care instructions, Alra deodorant and Radiaplex gel.  Reviewed areas of pertinence such as fatigue, hair loss, skin changes, breast tenderness and breast swelling . Pt able to give teach back of to pat skin and use unscented/gentle soap,apply Radiaplex bid, avoid applying anything to skin within 4 hours of treatment and avoid wearing an under wire bra. Pt demonstrated understanding and verbalizes understanding of information given and will contact nursing with any questions or concerns.     Http://rtanswers.org/treatmentinformation/whattoexpect/index  Loma Sousa, RN BSN

## 2017-09-29 ENCOUNTER — Ambulatory Visit
Admission: RE | Admit: 2017-09-29 | Discharge: 2017-09-29 | Disposition: A | Discharge status: Home / Self Care | Payer: Medicare Other | Source: Ambulatory Visit | Attending: Radiation Oncology | Admitting: Radiation Oncology

## 2017-09-29 DIAGNOSIS — C50411 Malignant neoplasm of upper-outer quadrant of right female breast: Secondary | ICD-10-CM | POA: Diagnosis not present

## 2017-09-29 DIAGNOSIS — Z170 Estrogen receptor positive status [ER+]: Secondary | ICD-10-CM | POA: Diagnosis not present

## 2017-09-29 DIAGNOSIS — Z510 Encounter for antineoplastic radiation therapy: Secondary | ICD-10-CM | POA: Diagnosis not present

## 2017-09-30 ENCOUNTER — Ambulatory Visit
Admission: RE | Admit: 2017-09-30 | Discharge: 2017-09-30 | Disposition: A | Discharge status: Home / Self Care | Payer: Medicare Other | Source: Ambulatory Visit | Attending: Radiation Oncology | Admitting: Radiation Oncology

## 2017-09-30 DIAGNOSIS — Z170 Estrogen receptor positive status [ER+]: Secondary | ICD-10-CM | POA: Diagnosis not present

## 2017-09-30 DIAGNOSIS — C50411 Malignant neoplasm of upper-outer quadrant of right female breast: Secondary | ICD-10-CM | POA: Diagnosis not present

## 2017-09-30 DIAGNOSIS — Z510 Encounter for antineoplastic radiation therapy: Secondary | ICD-10-CM | POA: Diagnosis not present

## 2017-10-01 ENCOUNTER — Ambulatory Visit
Admission: RE | Admit: 2017-10-01 | Discharge: 2017-10-01 | Disposition: A | Discharge status: Home / Self Care | Payer: Medicare Other | Source: Ambulatory Visit | Attending: Radiation Oncology | Admitting: Radiation Oncology

## 2017-10-01 DIAGNOSIS — C50411 Malignant neoplasm of upper-outer quadrant of right female breast: Secondary | ICD-10-CM | POA: Diagnosis not present

## 2017-10-01 DIAGNOSIS — Z510 Encounter for antineoplastic radiation therapy: Secondary | ICD-10-CM | POA: Diagnosis not present

## 2017-10-01 DIAGNOSIS — Z170 Estrogen receptor positive status [ER+]: Secondary | ICD-10-CM | POA: Diagnosis not present

## 2017-10-04 ENCOUNTER — Ambulatory Visit
Admission: RE | Admit: 2017-10-04 | Discharge: 2017-10-04 | Disposition: A | Discharge status: Home / Self Care | Payer: Medicare Other | Source: Ambulatory Visit | Attending: Radiation Oncology | Admitting: Radiation Oncology

## 2017-10-04 DIAGNOSIS — Z170 Estrogen receptor positive status [ER+]: Secondary | ICD-10-CM | POA: Diagnosis not present

## 2017-10-04 DIAGNOSIS — Z510 Encounter for antineoplastic radiation therapy: Secondary | ICD-10-CM | POA: Diagnosis not present

## 2017-10-04 DIAGNOSIS — C50411 Malignant neoplasm of upper-outer quadrant of right female breast: Secondary | ICD-10-CM | POA: Diagnosis not present

## 2017-10-05 ENCOUNTER — Ambulatory Visit
Admission: RE | Admit: 2017-10-05 | Discharge: 2017-10-05 | Disposition: A | Discharge status: Home / Self Care | Payer: Medicare Other | Source: Ambulatory Visit | Attending: Radiation Oncology | Admitting: Radiation Oncology

## 2017-10-05 DIAGNOSIS — Z510 Encounter for antineoplastic radiation therapy: Secondary | ICD-10-CM | POA: Diagnosis not present

## 2017-10-05 DIAGNOSIS — C50411 Malignant neoplasm of upper-outer quadrant of right female breast: Secondary | ICD-10-CM | POA: Diagnosis not present

## 2017-10-05 DIAGNOSIS — Z170 Estrogen receptor positive status [ER+]: Secondary | ICD-10-CM | POA: Diagnosis not present

## 2017-10-06 ENCOUNTER — Ambulatory Visit
Admission: RE | Admit: 2017-10-06 | Discharge: 2017-10-06 | Disposition: A | Discharge status: Home / Self Care | Payer: Medicare Other | Source: Ambulatory Visit | Attending: Radiation Oncology | Admitting: Radiation Oncology

## 2017-10-06 DIAGNOSIS — C50411 Malignant neoplasm of upper-outer quadrant of right female breast: Secondary | ICD-10-CM | POA: Diagnosis not present

## 2017-10-06 DIAGNOSIS — Z510 Encounter for antineoplastic radiation therapy: Secondary | ICD-10-CM | POA: Diagnosis not present

## 2017-10-06 DIAGNOSIS — Z170 Estrogen receptor positive status [ER+]: Secondary | ICD-10-CM | POA: Diagnosis not present

## 2017-10-07 ENCOUNTER — Ambulatory Visit
Admission: RE | Admit: 2017-10-07 | Discharge: 2017-10-07 | Disposition: A | Discharge status: Home / Self Care | Payer: Medicare Other | Source: Ambulatory Visit | Attending: Radiation Oncology | Admitting: Radiation Oncology

## 2017-10-07 DIAGNOSIS — C50411 Malignant neoplasm of upper-outer quadrant of right female breast: Secondary | ICD-10-CM | POA: Diagnosis not present

## 2017-10-07 DIAGNOSIS — Z510 Encounter for antineoplastic radiation therapy: Secondary | ICD-10-CM | POA: Diagnosis not present

## 2017-10-07 DIAGNOSIS — Z170 Estrogen receptor positive status [ER+]: Secondary | ICD-10-CM | POA: Diagnosis not present

## 2017-10-08 ENCOUNTER — Ambulatory Visit
Admission: RE | Admit: 2017-10-08 | Discharge: 2017-10-08 | Disposition: A | Discharge status: Home / Self Care | Payer: Medicare Other | Source: Ambulatory Visit | Attending: Radiation Oncology | Admitting: Radiation Oncology

## 2017-10-08 DIAGNOSIS — C50411 Malignant neoplasm of upper-outer quadrant of right female breast: Secondary | ICD-10-CM | POA: Diagnosis not present

## 2017-10-08 DIAGNOSIS — Z510 Encounter for antineoplastic radiation therapy: Secondary | ICD-10-CM | POA: Diagnosis not present

## 2017-10-08 DIAGNOSIS — Z170 Estrogen receptor positive status [ER+]: Secondary | ICD-10-CM | POA: Diagnosis not present

## 2017-10-11 ENCOUNTER — Ambulatory Visit
Admission: RE | Admit: 2017-10-11 | Discharge: 2017-10-11 | Disposition: A | Discharge status: Home / Self Care | Payer: Medicare Other | Source: Ambulatory Visit | Attending: Radiation Oncology | Admitting: Radiation Oncology

## 2017-10-11 DIAGNOSIS — Z170 Estrogen receptor positive status [ER+]: Secondary | ICD-10-CM | POA: Diagnosis not present

## 2017-10-11 DIAGNOSIS — Z510 Encounter for antineoplastic radiation therapy: Secondary | ICD-10-CM | POA: Diagnosis not present

## 2017-10-11 DIAGNOSIS — C50411 Malignant neoplasm of upper-outer quadrant of right female breast: Secondary | ICD-10-CM | POA: Diagnosis not present

## 2017-10-12 ENCOUNTER — Ambulatory Visit
Admission: RE | Admit: 2017-10-12 | Discharge: 2017-10-12 | Disposition: A | Discharge status: Home / Self Care | Payer: Medicare Other | Source: Ambulatory Visit | Attending: Radiation Oncology | Admitting: Radiation Oncology

## 2017-10-12 DIAGNOSIS — Z510 Encounter for antineoplastic radiation therapy: Secondary | ICD-10-CM | POA: Diagnosis not present

## 2017-10-12 DIAGNOSIS — Z170 Estrogen receptor positive status [ER+]: Secondary | ICD-10-CM | POA: Diagnosis not present

## 2017-10-12 DIAGNOSIS — C50411 Malignant neoplasm of upper-outer quadrant of right female breast: Secondary | ICD-10-CM | POA: Diagnosis not present

## 2017-10-13 ENCOUNTER — Ambulatory Visit
Admission: RE | Admit: 2017-10-13 | Discharge: 2017-10-13 | Disposition: A | Discharge status: Home / Self Care | Payer: Medicare Other | Source: Ambulatory Visit | Attending: Radiation Oncology | Admitting: Radiation Oncology

## 2017-10-13 DIAGNOSIS — C50411 Malignant neoplasm of upper-outer quadrant of right female breast: Secondary | ICD-10-CM | POA: Diagnosis not present

## 2017-10-13 DIAGNOSIS — Z170 Estrogen receptor positive status [ER+]: Secondary | ICD-10-CM | POA: Diagnosis not present

## 2017-10-13 DIAGNOSIS — Z510 Encounter for antineoplastic radiation therapy: Secondary | ICD-10-CM | POA: Diagnosis not present

## 2017-10-14 ENCOUNTER — Ambulatory Visit
Admission: RE | Admit: 2017-10-14 | Discharge: 2017-10-14 | Disposition: A | Discharge status: Home / Self Care | Payer: Medicare Other | Source: Ambulatory Visit | Attending: Radiation Oncology | Admitting: Radiation Oncology

## 2017-10-14 DIAGNOSIS — Z170 Estrogen receptor positive status [ER+]: Secondary | ICD-10-CM | POA: Diagnosis not present

## 2017-10-14 DIAGNOSIS — Z510 Encounter for antineoplastic radiation therapy: Secondary | ICD-10-CM | POA: Diagnosis not present

## 2017-10-14 DIAGNOSIS — C50411 Malignant neoplasm of upper-outer quadrant of right female breast: Secondary | ICD-10-CM | POA: Diagnosis not present

## 2017-10-15 ENCOUNTER — Ambulatory Visit: Payer: Medicare Other

## 2017-10-18 ENCOUNTER — Ambulatory Visit: Payer: Medicare Other

## 2017-10-18 ENCOUNTER — Ambulatory Visit
Admission: RE | Admit: 2017-10-18 | Discharge: 2017-10-18 | Disposition: A | Discharge status: Home / Self Care | Payer: Medicare Other | Source: Ambulatory Visit | Attending: Radiation Oncology | Admitting: Radiation Oncology

## 2017-10-18 DIAGNOSIS — Z510 Encounter for antineoplastic radiation therapy: Secondary | ICD-10-CM | POA: Diagnosis not present

## 2017-10-18 DIAGNOSIS — C50411 Malignant neoplasm of upper-outer quadrant of right female breast: Secondary | ICD-10-CM | POA: Diagnosis not present

## 2017-10-18 DIAGNOSIS — Z170 Estrogen receptor positive status [ER+]: Secondary | ICD-10-CM | POA: Diagnosis not present

## 2017-10-19 ENCOUNTER — Telehealth: Payer: Self-pay | Admitting: Hematology and Oncology

## 2017-10-19 ENCOUNTER — Inpatient Hospital Stay: Payer: Medicare Other | Attending: Hematology and Oncology | Admitting: Hematology and Oncology

## 2017-10-19 ENCOUNTER — Ambulatory Visit: Payer: Medicare Other

## 2017-10-19 ENCOUNTER — Ambulatory Visit
Admission: RE | Admit: 2017-10-19 | Discharge: 2017-10-19 | Disposition: A | Discharge status: Home / Self Care | Payer: Medicare Other | Source: Ambulatory Visit | Attending: Radiation Oncology | Admitting: Radiation Oncology

## 2017-10-19 DIAGNOSIS — Z9221 Personal history of antineoplastic chemotherapy: Secondary | ICD-10-CM | POA: Insufficient documentation

## 2017-10-19 DIAGNOSIS — Z170 Estrogen receptor positive status [ER+]: Secondary | ICD-10-CM | POA: Diagnosis not present

## 2017-10-19 DIAGNOSIS — Z79899 Other long term (current) drug therapy: Secondary | ICD-10-CM | POA: Diagnosis not present

## 2017-10-19 DIAGNOSIS — C50411 Malignant neoplasm of upper-outer quadrant of right female breast: Secondary | ICD-10-CM | POA: Insufficient documentation

## 2017-10-19 DIAGNOSIS — Z9012 Acquired absence of left breast and nipple: Secondary | ICD-10-CM | POA: Diagnosis not present

## 2017-10-19 DIAGNOSIS — Z7984 Long term (current) use of oral hypoglycemic drugs: Secondary | ICD-10-CM | POA: Diagnosis not present

## 2017-10-19 DIAGNOSIS — Z510 Encounter for antineoplastic radiation therapy: Secondary | ICD-10-CM | POA: Diagnosis not present

## 2017-10-19 NOTE — Telephone Encounter (Signed)
Per 7/30 los.  Gave patient avs and calendar.

## 2017-10-19 NOTE — Assessment & Plan Note (Signed)
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  Based on the biopsy, 0.3 cm IDC grade 2 ER 100%, PR 100%, HER-2 negative ratio 1.72, Ki-67 60%.  T1 a N0 stage IA  1986: Left breast cancer treated with mastectomy and reconstruction followed by she is in excellent physical health. Adjuvant chemotherapy and antiestrogen therapy with tamoxifen for 2 to 3 years Adjuvant radiation: 09/28/2017-10/19/2017  Recommendation: Antiestrogen therapy with letrozole 2.5 mg daily x5 years   Letrozole counseling: We discussed the risks and benefits of anti-estrogen therapy with aromatase inhibitors. These include but not limited to insomnia, hot flashes, mood changes, vaginal dryness, bone density loss, and weight gain. We strongly believe that the benefits far outweigh the risks. Patient understands these risks and consented to starting treatment. Planned treatment duration is 5 years.  Return to clinic in 3 months for survivorship care plan visit

## 2017-10-19 NOTE — Progress Notes (Signed)
Patient Care Team: Wenda Low, MD as PCP - General (Internal Medicine) Fanny Skates, MD as Consulting Physician (General Surgery) Nicholas Lose, MD as Consulting Physician (Hematology and Oncology) Eppie Gibson, MD as Attending Physician (Radiation Oncology)  DIAGNOSIS:  Encounter Diagnosis  Name Primary?  . Malignant neoplasm of upper-outer quadrant of right breast in female, estrogen receptor positive ([place])     SUMMARY OF ONCOLOGIC HISTORY: Oncology History   Declined Genetic testing on Aug 06, 2017     Breast cancer, left breast ([place])   1986 Initial Diagnosis    Breast cancer, left breast treated with mastectomy and reconstruction followed by adjuvant chemotherapy that incorporated Vincristine followed by tamoxifen for 2 to 3 years       Malignant neoplasm of upper-outer quadrant of right breast in female, estrogen receptor positive ([place])   07/27/2017 Initial Diagnosis    Screening detected asymmetry in the left breast 0.4 cm at 9 o'clock position 8 cm from nipple axilla negative: Biopsy revealed DCIS with probable invasive carcinoma grade 2, ER 100%, PR 100%, HER-2 negative ratio 1.72, Ki-67 60%, T1 a N0 stage I a clinical stage AJCC 8      08/18/2017 Surgery    Right 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  Based on the biopsy, 0.3 cm IDC grade 2 ER 100%, PR 100%, HER-2 negative ratio 1.72, Ki-67 60%.  T1 a N0 stage IA       09/27/2017 - 10/19/2017 Radiation Therapy    Adj XRT       Anti-estrogen oral therapy    Patient refused Anti-estrogen therapy       CHIEF COMPLIANT: Follow-up towards end of radiation  INTERVAL HISTORY: Ana Coffey is a 72 year old with above-mentioned history of right breast cancer treated with lumpectomy and is currently undergoing adjuvant radiation.  She is here today to discuss the adjuvant treatment plan.  She is doing quite well from the effects  of radiation.  She is got some discomfort in the axilla.  REVIEW OF SYSTEMS:   Constitutional: Denies fevers, chills or abnormal weight loss Eyes: Denies blurriness of vision Ears, nose, mouth, throat, and face: Denies mucositis or sore throat Respiratory: Denies cough, dyspnea or wheezes Cardiovascular: Denies palpitation, chest discomfort Gastrointestinal:  Denies nausea, heartburn or change in bowel habits Skin: Denies abnormal skin rashes Lymphatics: Denies new lymphadenopathy or easy bruising Neurological:Denies numbness, tingling or new weaknesses Behavioral/Psych: Mood is stable, no new changes  Extremities: No lower extremity edema Breast:  denies any pain or lumps or nodules in either breasts All other systems were reviewed with the patient and are negative.  I have reviewed the past medical history, past surgical history, social history and family history with the patient and they are unchanged from previous note.  ALLERGIES:  has No Known Allergies.  MEDICATIONS:  Current Outpatient Medications  Medication Sig Dispense Refill  . Cholecalciferol (VITAMIN D3) 1000 units CAPS Take by mouth daily.    . Cinnamon 500 MG TABS Take by mouth.    . hydrochlorothiazide (HYDRODIURIL) 25 MG tablet Take 25 mg by mouth daily.    . metFORMIN (GLUCOPHAGE) 500 MG tablet   3  . multivitamin-iron-minerals-folic acid (CENTRUM) chewable tablet Chew 1 tablet by mouth daily.    Marland Kitchen OVER THE COUNTER MEDICATION Magnesium 250 mg daily per patient    . simvastatin (ZOCOR) 10 MG tablet   3  . Turmeric Curcumin 500 MG  CAPS Take 500 mg by mouth. 2 per day    . vitamin B-12 (CYANOCOBALAMIN) 1000 MCG tablet Take 5,000 mcg by mouth.    . zinc gluconate 50 MG tablet Take 50 mg by mouth daily.     No current facility-administered medications for this visit.     PHYSICAL EXAMINATION: ECOG PERFORMANCE STATUS: 1 - Symptomatic but completely ambulatory  Vitals:   10/19/17 1441  BP: 139/72  Pulse: 77    Resp: 18  Temp: 98.4 F (36.9 C)  SpO2: 99%   Filed Weights   10/19/17 1441  Weight: 179 lb 1.6 oz (81.2 kg)    GENERAL:alert, no distress and comfortable SKIN: skin color, texture, turgor are normal, no rashes or significant lesions EYES: normal, Conjunctiva are pink and non-injected, sclera clear OROPHARYNX:no exudate, no erythema and lips, buccal mucosa, and tongue normal  NECK: supple, thyroid normal size, non-tender, without nodularity LYMPH:  no palpable lymphadenopathy in the cervical, axillary or inguinal LUNGS: clear to auscultation and percussion with normal breathing effort HEART: regular rate & rhythm and no murmurs and no lower extremity edema ABDOMEN:abdomen soft, non-tender and normal bowel sounds MUSCULOSKELETAL:no cyanosis of digits and no clubbing  NEURO: alert & oriented x 3 with fluent speech, no focal motor/sensory deficits EXTREMITIES: No lower extremity edema   LABORATORY DATA:  I have reviewed the data as listed CMP Latest Ref Rng & Units 08/04/2017  Glucose 70 - 140 mg/dL 109  BUN 7 - 26 mg/dL 16  Creatinine 0.60 - 1.10 mg/dL 0.94  Sodium 136 - 145 mmol/L 141  Potassium 3.5 - 5.1 mmol/L 4.0  Chloride 98 - 109 mmol/L 104  CO2 22 - 29 mmol/L 30(H)  Calcium 8.4 - 10.4 mg/dL 9.6  Total Protein 6.4 - 8.3 g/dL 7.0  Total Bilirubin 0.2 - 1.2 mg/dL 0.4  Alkaline Phos 40 - 150 U/L 70  AST 5 - 34 U/L 25  ALT 0 - 55 U/L 25    Lab Results  Component Value Date   WBC 3.5 (L) 08/04/2017   HGB 14.1 08/04/2017   HCT 42.9 08/04/2017   MCV 87.4 08/04/2017   PLT 195 08/04/2017   NEUTROABS 2.0 08/04/2017    ASSESSMENT & PLAN:  Malignant neoplasm of upper-outer quadrant of right breast in female, estrogen receptor positive ([place]) 08/18/2017:Right 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  Based on the biopsy, 0.3 cm IDC grade 2 ER 100%, PR 100%, HER-2 negative ratio 1.72, Ki-67  60%.  T1 a N0 stage IA  1986: Left breast cancer treated with mastectomy and reconstruction followed by she is in excellent physical health. Adjuvant chemotherapy and antiestrogen therapy with tamoxifen for 2 to 3 years Adjuvant radiation: 09/28/2017-10/19/2017  Recommendation: Antiestrogen therapy with letrozole 2.5 mg daily x5 years  After listening to the risks and benefits of letrozole, patient decided that she does not want to take antiestrogen therapy. We discussed the importance of exercise. Return to clinic in 6 months for survivorship care plan visit.  After that we can see her once a year.  No orders of the defined types were placed in this encounter.  The patient has a good understanding of the overall plan. she agrees with it. she will call with any problems that may develop before the next visit here.   Harriette Ohara, MD 10/19/17

## 2017-10-20 ENCOUNTER — Ambulatory Visit
Admission: RE | Admit: 2017-10-20 | Discharge: 2017-10-20 | Disposition: A | Discharge status: Home / Self Care | Payer: Medicare Other | Source: Ambulatory Visit | Attending: Radiation Oncology | Admitting: Radiation Oncology

## 2017-10-20 DIAGNOSIS — C50411 Malignant neoplasm of upper-outer quadrant of right female breast: Secondary | ICD-10-CM | POA: Diagnosis not present

## 2017-10-20 DIAGNOSIS — Z510 Encounter for antineoplastic radiation therapy: Secondary | ICD-10-CM | POA: Diagnosis not present

## 2017-10-20 DIAGNOSIS — Z170 Estrogen receptor positive status [ER+]: Secondary | ICD-10-CM | POA: Diagnosis not present

## 2017-10-21 ENCOUNTER — Ambulatory Visit
Admission: RE | Admit: 2017-10-21 | Discharge: 2017-10-21 | Disposition: A | Discharge status: Home / Self Care | Payer: Medicare Other | Source: Ambulatory Visit | Attending: Radiation Oncology | Admitting: Radiation Oncology

## 2017-10-21 DIAGNOSIS — Z510 Encounter for antineoplastic radiation therapy: Secondary | ICD-10-CM | POA: Insufficient documentation

## 2017-10-21 DIAGNOSIS — C50411 Malignant neoplasm of upper-outer quadrant of right female breast: Secondary | ICD-10-CM | POA: Insufficient documentation

## 2017-10-21 DIAGNOSIS — Z170 Estrogen receptor positive status [ER+]: Secondary | ICD-10-CM | POA: Insufficient documentation

## 2017-10-22 ENCOUNTER — Ambulatory Visit
Admission: RE | Admit: 2017-10-22 | Discharge: 2017-10-22 | Disposition: A | Discharge status: Home / Self Care | Payer: Medicare Other | Source: Ambulatory Visit | Attending: Radiation Oncology | Admitting: Radiation Oncology

## 2017-10-22 ENCOUNTER — Ambulatory Visit: Payer: Medicare Other

## 2017-10-22 DIAGNOSIS — Z510 Encounter for antineoplastic radiation therapy: Secondary | ICD-10-CM | POA: Diagnosis not present

## 2017-10-22 DIAGNOSIS — Z170 Estrogen receptor positive status [ER+]: Secondary | ICD-10-CM | POA: Diagnosis not present

## 2017-10-22 DIAGNOSIS — C50411 Malignant neoplasm of upper-outer quadrant of right female breast: Secondary | ICD-10-CM | POA: Diagnosis not present

## 2017-10-25 ENCOUNTER — Ambulatory Visit
Admission: RE | Admit: 2017-10-25 | Discharge: 2017-10-25 | Disposition: A | Discharge status: Home / Self Care | Payer: Medicare Other | Source: Ambulatory Visit | Attending: Radiation Oncology | Admitting: Radiation Oncology

## 2017-10-25 ENCOUNTER — Encounter: Payer: Self-pay | Admitting: *Deleted

## 2017-10-25 DIAGNOSIS — Z510 Encounter for antineoplastic radiation therapy: Secondary | ICD-10-CM | POA: Diagnosis not present

## 2017-10-25 DIAGNOSIS — Z170 Estrogen receptor positive status [ER+]: Secondary | ICD-10-CM | POA: Diagnosis not present

## 2017-10-25 DIAGNOSIS — C50411 Malignant neoplasm of upper-outer quadrant of right female breast: Secondary | ICD-10-CM | POA: Diagnosis not present

## 2017-11-05 ENCOUNTER — Encounter: Payer: Self-pay | Admitting: Radiation Oncology

## 2017-11-05 NOTE — Progress Notes (Signed)
  Radiation Oncology         [phone]) [phone] ________________________________  Name: Ana Coffey MRN: [id number]  Date: 11/05/2017  DOB: [birth year]  End of Treatment Note  Diagnosis:   Cancer Staging Malignant neoplasm of upper-outer quadrant of right breast in female, estrogen receptor positive ([place]) Staging form: Breast, AJCC 8th Edition - Clinical stage from 08/04/2017: Stage IA (cT1a, cN0, cM0, G2, ER+, PR+, HER2-) - Unsigned Pathologic stage T1aN0M0  Indication for treatment:  Curative       Radiation treatment dates:   09/27/2017-10/25/2017  Site/dose:   1. Right breast, 2.67 Gy x 15 fractions for a total dose of 40.05 Gy            2. Right breast boost, 2 Gy x 5 fractions for a total dose of 10 Gy  Beams/energy:   1. 3D, 10X         2. Electron, 18E  Narrative: The patient tolerated radiation treatment relatively well. At the beginning of her treatment, she denied fatigue, pain, or any skin concerns. She was given Radiaplex and instructed on its use. Towards the end of her treatment, she noted that she was doing well. On PE, there was mild darkening of axilla and right breast with her skin intact.   Plan: The patient has completed radiation treatment. The patient will return to radiation oncology clinic for routine followup in one month. I advised them to call or return sooner if they have any questions or concerns related to their recovery or treatment.  -----------------------------------  Eppie Gibson, MD  This document serves as a record of services personally performed by Eppie Gibson, MD. It was created on her behalf by Steva Colder, a trained medical scribe. The creation of this record is based on the scribe's personal observations and the provider's statements to them. This document has been checked and approved by the attending provider.

## 2017-12-22 DIAGNOSIS — Z23 Encounter for immunization: Secondary | ICD-10-CM | POA: Diagnosis not present

## 2018-02-15 DIAGNOSIS — L739 Follicular disorder, unspecified: Secondary | ICD-10-CM | POA: Diagnosis not present

## 2018-03-24 DIAGNOSIS — R350 Frequency of micturition: Secondary | ICD-10-CM | POA: Diagnosis not present

## 2018-04-08 DIAGNOSIS — Z1389 Encounter for screening for other disorder: Secondary | ICD-10-CM | POA: Diagnosis not present

## 2018-04-08 DIAGNOSIS — E119 Type 2 diabetes mellitus without complications: Secondary | ICD-10-CM | POA: Diagnosis not present

## 2018-04-08 DIAGNOSIS — I10 Essential (primary) hypertension: Secondary | ICD-10-CM | POA: Diagnosis not present

## 2018-04-08 DIAGNOSIS — R232 Flushing: Secondary | ICD-10-CM | POA: Diagnosis not present

## 2018-04-08 DIAGNOSIS — Z7984 Long term (current) use of oral hypoglycemic drugs: Secondary | ICD-10-CM | POA: Diagnosis not present

## 2018-04-08 DIAGNOSIS — C50412 Malignant neoplasm of upper-outer quadrant of left female breast: Secondary | ICD-10-CM | POA: Diagnosis not present

## 2018-04-08 DIAGNOSIS — Z0000 Encounter for general adult medical examination without abnormal findings: Secondary | ICD-10-CM | POA: Diagnosis not present

## 2018-04-08 DIAGNOSIS — E7800 Pure hypercholesterolemia, unspecified: Secondary | ICD-10-CM | POA: Diagnosis not present

## 2018-04-08 DIAGNOSIS — E1169 Type 2 diabetes mellitus with other specified complication: Secondary | ICD-10-CM | POA: Diagnosis not present

## 2018-04-08 DIAGNOSIS — B029 Zoster without complications: Secondary | ICD-10-CM | POA: Diagnosis not present

## 2018-04-12 ENCOUNTER — Telehealth: Payer: Self-pay | Admitting: Adult Health

## 2018-04-12 NOTE — Telephone Encounter (Signed)
[facility] - moved SCP visit from 1/31 to 2/18. Left message. Schedule mailed.

## 2018-04-18 DIAGNOSIS — C50411 Malignant neoplasm of upper-outer quadrant of right female breast: Secondary | ICD-10-CM | POA: Diagnosis not present

## 2018-04-18 DIAGNOSIS — Z9889 Other specified postprocedural states: Secondary | ICD-10-CM | POA: Diagnosis not present

## 2018-04-18 DIAGNOSIS — E119 Type 2 diabetes mellitus without complications: Secondary | ICD-10-CM | POA: Diagnosis not present

## 2018-04-18 DIAGNOSIS — Z789 Other specified health status: Secondary | ICD-10-CM | POA: Diagnosis not present

## 2018-04-18 DIAGNOSIS — Z9012 Acquired absence of left breast and nipple: Secondary | ICD-10-CM | POA: Diagnosis not present

## 2018-04-18 DIAGNOSIS — Z803 Family history of malignant neoplasm of breast: Secondary | ICD-10-CM | POA: Diagnosis not present

## 2018-04-22 ENCOUNTER — Encounter: Payer: Medicare Other | Admitting: Adult Health

## 2018-05-04 ENCOUNTER — Telehealth: Payer: Self-pay

## 2018-05-04 NOTE — Telephone Encounter (Signed)
LVM for patient reminding of SCP visit with NP on 05/10/18 at 2 pm.  Center number left for call back with questions.

## 2018-05-10 ENCOUNTER — Encounter: Payer: Self-pay | Admitting: Adult Health

## 2018-05-10 ENCOUNTER — Telehealth: Payer: Self-pay | Admitting: Hematology and Oncology

## 2018-05-10 ENCOUNTER — Inpatient Hospital Stay: Payer: Medicare Other | Attending: Adult Health | Admitting: Adult Health

## 2018-05-10 VITALS — BP 159/81 | HR 64 | Temp 98.0°F | Resp 18 | Ht 63.5 in | Wt 181.3 lb

## 2018-05-10 DIAGNOSIS — Z170 Estrogen receptor positive status [ER+]: Secondary | ICD-10-CM | POA: Insufficient documentation

## 2018-05-10 DIAGNOSIS — Z923 Personal history of irradiation: Secondary | ICD-10-CM | POA: Insufficient documentation

## 2018-05-10 DIAGNOSIS — I10 Essential (primary) hypertension: Secondary | ICD-10-CM | POA: Diagnosis not present

## 2018-05-10 DIAGNOSIS — C50411 Malignant neoplasm of upper-outer quadrant of right female breast: Secondary | ICD-10-CM | POA: Diagnosis not present

## 2018-05-10 DIAGNOSIS — Z9012 Acquired absence of left breast and nipple: Secondary | ICD-10-CM | POA: Insufficient documentation

## 2018-05-10 DIAGNOSIS — E119 Type 2 diabetes mellitus without complications: Secondary | ICD-10-CM | POA: Insufficient documentation

## 2018-05-10 NOTE — Telephone Encounter (Signed)
Gave avs and calendar ° °

## 2018-05-10 NOTE — Progress Notes (Signed)
CLINIC:  Survivorship   REASON FOR VISIT:  Routine follow-up post-treatment for a recent history of breast cancer.  BRIEF ONCOLOGIC HISTORY:  Oncology History   Declined Genetic testing on Aug 06, 2017     Breast cancer, left breast ([place])   1986 Initial Diagnosis    Breast cancer, left breast treated with mastectomy and reconstruction followed by adjuvant chemotherapy that incorporated Vincristine followed by tamoxifen for 2 to 3 years     Malignant neoplasm of upper-outer quadrant of right breast in female, estrogen receptor positive ([place])   07/27/2017 Initial Diagnosis    Screening detected asymmetry in the left breast 0.4 cm at 9 o'clock position 8 cm from nipple axilla negative: Biopsy revealed DCIS with probable invasive carcinoma grade 2, ER 100%, PR 100%, HER-2 negative ratio 1.72, Ki-67 60%, T1 a N0 stage I a clinical stage AJCC 8    08/18/2017 Surgery    Right 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  Based on the biopsy, 0.3 cm IDC grade 2 ER 100%, PR 100%, HER-2 negative ratio 1.72, Ki-67 60%.  T1 a N0 stage IA     09/27/2017 - 10/19/2017 Radiation Therapy    Adj XRT     Anti-estrogen oral therapy    Patient refused Anti-estrogen therapy     INTERVAL HISTORY:  Ana Coffey presents to the [facility] today for our initial meeting to review her survivorship care plan detailing her treatment course for breast cancer, as well as monitoring long-term side effects of that treatment, education regarding health maintenance, screening, and overall wellness and health promotion.     Overall, Ana Coffey reports feeling quite well since completing her radiation therapy.  She is doing well.  She had a birthday 4 days ago.  She is here with her husband.      REVIEW OF SYSTEMS:  Review of Systems  Constitutional: Negative for appetite change, chills, fatigue, fever and unexpected weight change.   HENT:   Negative for hearing loss, lump/mass and trouble swallowing.   Eyes: Negative for eye problems and icterus.  Respiratory: Negative for chest tightness, cough and shortness of breath.   Cardiovascular: Negative for chest pain, leg swelling and palpitations.  Gastrointestinal: Negative for abdominal distention, abdominal pain, constipation, diarrhea, nausea and vomiting.  Endocrine: Negative for hot flashes.  Genitourinary: Negative for difficulty urinating.   Musculoskeletal: Negative for arthralgias and back pain.  Skin: Negative for itching and rash.  Neurological: Negative for dizziness, extremity weakness, headaches and numbness.  Hematological: Negative for adenopathy.  Psychiatric/Behavioral: Negative for depression. The patient is not nervous/anxious.    Breast: Denies any new nodularity, masses, tenderness, nipple changes, or nipple discharge.      ONCOLOGY TREATMENT TEAM:  1. Surgeon:  Dr. Dalbert Batman at [facility] 2. Medical Oncologist: Dr. Lindi Adie  3. Radiation Oncologist: Dr. Isidore Moos    PAST MEDICAL/SURGICAL HISTORY:  Past Medical History:  Diagnosis Date  . Allergy   . Breast cancer, left breast ([place]) 1986  . Candida vaginitis   . Diabetes mellitus without complication ([place])   . Fibroids   . Hypertension   . Menopausal symptoms   . Pelvic relaxation   . Postmenopausal bleeding    Past Surgical History:  Procedure Laterality Date  . BREAST LUMPECTOMY WITH RADIOACTIVE SEED AND SENTINEL LYMPH NODE BIOPSY Right 08/18/2017   Procedure: BREAST LUMPECTOMY WITH RADIOACTIVE SEED AND DEEP AXILLARY SENTINEL LYMPH NODE BIOPSY  WITH BLUE DYE INJECTION;  Surgeon: Fanny Skates, MD;  Location: [facility];  Service: General;  Laterality: Right;  . MASTECTOMY  1986   left breast     ALLERGIES:  No Known Allergies   CURRENT MEDICATIONS:  Outpatient Encounter Medications as of 05/10/2018  Medication Sig  . Cholecalciferol (VITAMIN D3) 1000  units CAPS Take by mouth daily.  . Cinnamon 500 MG TABS Take by mouth.  . hydrochlorothiazide (HYDRODIURIL) 25 MG tablet Take 25 mg by mouth daily.  . metFORMIN (GLUCOPHAGE) 500 MG tablet   . multivitamin-iron-minerals-folic acid (CENTRUM) chewable tablet Chew 1 tablet by mouth daily.  Marland Kitchen OVER THE COUNTER MEDICATION Magnesium 250 mg daily per patient  . simvastatin (ZOCOR) 10 MG tablet   . Turmeric Curcumin 500 MG CAPS Take 500 mg by mouth. 2 per day  . vitamin B-12 (CYANOCOBALAMIN) 1000 MCG tablet Take 5,000 mcg by mouth.  . zinc gluconate 50 MG tablet Take 50 mg by mouth daily.   No facility-administered encounter medications on file as of 05/10/2018.      ONCOLOGIC FAMILY HISTORY:  Family History  Problem Relation Age of Onset  . Cancer Mother   . Breast cancer Mother      GENETIC COUNSELING/TESTING: Not at this time  SOCIAL HISTORY:  Social History   Socioeconomic History  . Marital status: Married    Spouse name: Not on file  . Number of children: Not on file  . Years of education: Not on file  . Highest education level: Not on file  Occupational History  . Not on file  Social Needs  . Financial resource strain: Not on file  . Food insecurity:    Worry: Not on file    Inability: Not on file  . Transportation needs:    Medical: Not on file    Non-medical: Not on file  Tobacco Use  . Smoking status: Never Smoker  . Smokeless tobacco: Never Used  Substance and Sexual Activity  . Alcohol use: Yes    Comment: Drinks 1 drink a week  . Drug use: No  . Sexual activity: Not on file  Lifestyle  . Physical activity:    Days per week: Not on file    Minutes per session: Not on file  . Stress: Not on file  Relationships  . Social connections:    Talks on phone: Not on file    Gets together: Not on file    Attends religious service: Not on file    Active member of club or organization: Not on file    Attends meetings of clubs or organizations: Not on file     Relationship status: Not on file  . Intimate partner violence:    Fear of current or ex partner: Not on file    Emotionally abused: Not on file    Physically abused: Not on file    Forced sexual activity: Not on file  Other Topics Concern  . Not on file  Social History Narrative  . Not on file      PHYSICAL EXAMINATION:  Vital Signs:   Vitals:   05/10/18 1416  BP: (!) 159/81  Pulse: 64  Resp: 18  Temp: 98 F (36.7 C)  SpO2: 100%   Filed Weights   05/10/18 1416  Weight: 181 lb 5 oz (82.2 kg)   General: Well-nourished, well-appearing female in no acute distress.  Marland Kitchen   HEENT: Head is normocephalic.  Pupils equal and reactive to light. Conjunctivae  clear without exudate.  Sclerae anicteric. Oral mucosa is pink, moist.  Oropharynx is pink without lesions or erythema.  Lymph: No cervical, supraclavicular, or infraclavicular lymphadenopathy noted on palpation.  Cardiovascular: Regular rate and rhythm.Marland Kitchen Respiratory: Clear to auscultation bilaterally. Chest expansion symmetric; breathing non-labored.  Breasts: left breast s/p mastectomy and reconstruction, no sign of local recurrence, right breast s/p lumpectomy and radiation, no sign of local recurrence GI: Abdomen soft and round; non-tender, non-distended. Bowel sounds normoactive.  GU: Deferred.  Neuro: No focal deficits. Steady gait.  Psych: Mood and affect normal and appropriate for situation.  Extremities: No edema. MSK: No focal spinal tenderness to palpation.  Full range of motion in bilateral upper extremities Skin: Warm and dry.  LABORATORY DATA:  None for this visit.  DIAGNOSTIC IMAGING:  None for this visit.      ASSESSMENT AND PLAN:  Ms.. Coffey is a pleasant 73 y.o. female with Stage IA right breast invasive ductal carcinoma, ER+/PR+/HER2-, diagnosed in 07/2017, treated with lumpectomy and adjuvant radiation therapy.  She presents to the Survivorship Clinic for our initial meeting and routine follow-up  post-completion of treatment for breast cancer.    1. Stage IA right breast cancer:  Ana Coffey is continuing to recover from definitive treatment for breast cancer. She will follow-up with her medical oncologist, Dr. Lindi Adie in one year with history and physical exam per surveillance protocol.  She will see Dr. Dalbert Batman in 6 months. Today, a comprehensive survivorship care plan and treatment summary was reviewed with the patient today detailing her breast cancer diagnosis, treatment course, potential late/long-term effects of treatment, appropriate follow-up care with recommendations for the future, and patient education resources.  A copy of this summary, along with a letter will be sent to the patient's primary care provider via mail/fax/In Basket message after today's visit.    2. Bone health:  Given Ana Coffey's age/history of breast cancer, she is at risk for bone demineralization.  Her last DEXA scan was in 07/2015 and was normal.  She was given education on specific activities to promote bone health.  3. Cancer screening:  Due to Ana Coffey's history and her age, she should receive screening for skin cancers, colon cancer, and gynecologic cancers.  The information and recommendations are listed on the patient's comprehensive care plan/treatment summary and were reviewed in detail with the patient.    4. Health maintenance and wellness promotion: Ana Coffey was encouraged to consume 5-7 servings of fruits and vegetables per day. We reviewed the "Nutrition Rainbow" handout, as well as the handout "Take Control of Your Health and Reduce Your Cancer Risk" from the [facility].  She was also encouraged to engage in moderate to vigorous exercise for 30 minutes per day most days of the week. We discussed the [employer] fitness program, which is designed for cancer survivors to help them become more physically fit after cancer treatments.  She was instructed to limit her alcohol consumption and  continue to abstain from tobacco use.     5. Support services/counseling: It is not uncommon for this period of the patient's cancer care trajectory to be one of many emotions and stressors.  We discussed an opportunity for her to participate in the next session of [facility] ("Finding Your New Normal") support group series designed for patients after they have completed treatment.   Ana Coffey was encouraged to take advantage of our many other support services programs, support groups, and/or counseling in coping with her new life as  a cancer survivor after completing anti-cancer treatment.  She was offered support today through active listening and expressive supportive counseling.  She was given information regarding our available services and encouraged to contact me with any questions or for help enrolling in any of our support group/programs.    Dispo:   -Return to cancer center for f/u in one year with Dr. Lindi Adie  -Mammogram due in 06/2018 -Follow up with Dr. Dalbert Batman in 6 months -She is welcome to return back to the Survivorship Clinic at any time; no additional follow-up needed at this time.  -Consider referral back to survivorship as a long-term survivor for continued surveillance  A total of (30) minutes of face-to-face time was spent with this patient with greater than 50% of that time in counseling and care-coordination.   Gardenia Phlegm, NP Survivorship Program [facility] [phone]   Note: PRIMARY CARE PROVIDER Wenda Low, [place] [phone]

## 2018-09-02 DIAGNOSIS — D126 Benign neoplasm of colon, unspecified: Secondary | ICD-10-CM | POA: Diagnosis not present

## 2018-09-02 DIAGNOSIS — Z86010 Personal history of colonic polyps: Secondary | ICD-10-CM | POA: Diagnosis not present

## 2018-09-02 DIAGNOSIS — K5730 Diverticulosis of large intestine without perforation or abscess without bleeding: Secondary | ICD-10-CM | POA: Diagnosis not present

## 2018-09-06 DIAGNOSIS — D126 Benign neoplasm of colon, unspecified: Secondary | ICD-10-CM | POA: Diagnosis not present

## 2018-09-06 DIAGNOSIS — Z853 Personal history of malignant neoplasm of breast: Secondary | ICD-10-CM | POA: Diagnosis not present

## 2018-10-06 DIAGNOSIS — E1169 Type 2 diabetes mellitus with other specified complication: Secondary | ICD-10-CM | POA: Diagnosis not present

## 2018-10-06 DIAGNOSIS — I10 Essential (primary) hypertension: Secondary | ICD-10-CM | POA: Diagnosis not present

## 2018-10-06 DIAGNOSIS — E7800 Pure hypercholesterolemia, unspecified: Secondary | ICD-10-CM | POA: Diagnosis not present

## 2018-10-06 DIAGNOSIS — C50412 Malignant neoplasm of upper-outer quadrant of left female breast: Secondary | ICD-10-CM | POA: Diagnosis not present

## 2018-10-13 DIAGNOSIS — Z7984 Long term (current) use of oral hypoglycemic drugs: Secondary | ICD-10-CM | POA: Diagnosis not present

## 2018-10-13 DIAGNOSIS — E1169 Type 2 diabetes mellitus with other specified complication: Secondary | ICD-10-CM | POA: Diagnosis not present

## 2018-12-28 DIAGNOSIS — L237 Allergic contact dermatitis due to plants, except food: Secondary | ICD-10-CM | POA: Diagnosis not present

## 2018-12-28 DIAGNOSIS — B372 Candidiasis of skin and nail: Secondary | ICD-10-CM | POA: Diagnosis not present

## 2019-04-11 DIAGNOSIS — I10 Essential (primary) hypertension: Secondary | ICD-10-CM | POA: Diagnosis not present

## 2019-04-11 DIAGNOSIS — Z135 Encounter for screening for eye and ear disorders: Secondary | ICD-10-CM | POA: Diagnosis not present

## 2019-04-11 DIAGNOSIS — R232 Flushing: Secondary | ICD-10-CM | POA: Diagnosis not present

## 2019-04-11 DIAGNOSIS — E1169 Type 2 diabetes mellitus with other specified complication: Secondary | ICD-10-CM | POA: Diagnosis not present

## 2019-04-11 DIAGNOSIS — E7800 Pure hypercholesterolemia, unspecified: Secondary | ICD-10-CM | POA: Diagnosis not present

## 2019-04-11 DIAGNOSIS — C50412 Malignant neoplasm of upper-outer quadrant of left female breast: Secondary | ICD-10-CM | POA: Diagnosis not present

## 2019-04-11 DIAGNOSIS — Z0000 Encounter for general adult medical examination without abnormal findings: Secondary | ICD-10-CM | POA: Diagnosis not present

## 2019-04-11 DIAGNOSIS — E119 Type 2 diabetes mellitus without complications: Secondary | ICD-10-CM | POA: Diagnosis not present

## 2019-04-11 DIAGNOSIS — Z1389 Encounter for screening for other disorder: Secondary | ICD-10-CM | POA: Diagnosis not present

## 2019-04-12 DIAGNOSIS — C50411 Malignant neoplasm of upper-outer quadrant of right female breast: Secondary | ICD-10-CM | POA: Diagnosis not present

## 2019-04-12 DIAGNOSIS — Z853 Personal history of malignant neoplasm of breast: Secondary | ICD-10-CM | POA: Diagnosis not present

## 2019-04-14 ENCOUNTER — Ambulatory Visit: Payer: Medicare Other | Attending: Internal Medicine

## 2019-04-14 DIAGNOSIS — Z23 Encounter for immunization: Secondary | ICD-10-CM | POA: Insufficient documentation

## 2019-04-14 NOTE — Progress Notes (Signed)
   Covid-19 Vaccination Clinic  Name:  Ana Coffey    MRN: [id number] DOB: Jul 09, 1945  04/14/2019  Ana Coffey was observed post Covid-19 immunization for 15 minutes without incidence. She was provided with Vaccine Information Sheet and instruction to access the V-Safe system.   Ana Coffey was instructed to call 911 with any severe reactions post vaccine: Marland Kitchen Difficulty breathing  . Swelling of your face and throat  . A fast heartbeat  . A bad rash all over your body  . Dizziness and weakness    Immunizations Administered    Name Date Dose VIS Date Route   Pfizer COVID-19 Vaccine 04/14/2019  1:45 PM 0.3 mL 03/03/2019 Intramuscular   Manufacturer: [place]   Lot: [id number]   [place]: [id number]

## 2019-05-05 ENCOUNTER — Ambulatory Visit: Payer: Medicare Other | Attending: Internal Medicine

## 2019-05-05 DIAGNOSIS — Z23 Encounter for immunization: Secondary | ICD-10-CM | POA: Insufficient documentation

## 2019-05-05 NOTE — Progress Notes (Signed)
   Covid-19 Vaccination Clinic  Name:  Ana Coffey    MRN: [id number] DOB: 1945/06/13  05/05/2019  [name] was observed post Covid-19 immunization for 15 minutes without incidence. She was provided with Vaccine Information Sheet and instruction to access the V-Safe system.   [name] was instructed to call 911 with any severe reactions post vaccine: Marland Kitchen Difficulty breathing  . Swelling of your face and throat  . A fast heartbeat  . A bad rash all over your body  . Dizziness and weakness    Immunizations Administered    Name Date Dose VIS Date Route   Pfizer COVID-19 Vaccine 05/05/2019  1:48 PM 0.3 mL 03/03/2019 Intramuscular   Manufacturer: [place]   Lot: [id number]   [place]: [id number]

## 2019-05-10 NOTE — Progress Notes (Signed)
HEMATOLOGY-ONCOLOGY MYCHART VIDEO VISIT PROGRESS NOTE  I connected with Ana Coffey on 05/11/2019 at  2:00 PM EST by MyChart video conference and verified that I am speaking with the correct person using two identifiers.  I discussed the limitations, risks, security and privacy concerns of performing an evaluation and management service by MyChart and the availability of in person appointments.  I also discussed with the patient that there may be a patient responsible charge related to this service. The patient expressed understanding and agreed to proceed.  Patient's Location: Home Physician Location: Clinic  CHIEF COMPLIANT: Surveillance of right breast cancer  INTERVAL HISTORY: Ana Coffey is a 74 y.o. female with above-mentioned history of right breast cancer treated with lumpectomy, radiation, and who declined antiestrogen therapy and is currently on surveillance. She presents over MyChart today for follow-up.  Her biggest symptom today is hot flashes which are bothering her.  Previously we prescribed her venlafaxine but she could not tolerate it.  She stopped after 2 to 3 days.  Oncology History Overview Note  Declined Genetic testing on Aug 06, 2017   Breast cancer, left breast ([place])  1986 Initial Diagnosis   Breast cancer, left breast treated with mastectomy and reconstruction followed by adjuvant chemotherapy that incorporated Vincristine followed by tamoxifen for 2 to 3 years   Malignant neoplasm of upper-outer quadrant of right breast in female, estrogen receptor positive ([place])  07/27/2017 Initial Diagnosis   Screening detected asymmetry in the left breast 0.4 cm at 9 o'clock position 8 cm from nipple axilla negative: Biopsy revealed DCIS with probable invasive carcinoma grade 2, ER 100%, PR 100%, HER-2 negative ratio 1.72, Ki-67 60%, T1 a N0 stage I a clinical stage AJCC 8   08/18/2017 Surgery   Right lumpectomy: DCIS high-grade 0.3 cm, 0/2 lymph nodes negative no  evidence of invasive breast cancer on the final pathology.  Based on the biopsy, 0.3 cm IDC grade 2 ER 100%, PR 100%, HER-2 negative ratio 1.72, Ki-67 60%.  T1 a N0 stage IA    09/27/2017 - 10/19/2017 Radiation Therapy   Adj XRT    Anti-estrogen oral therapy   Patient refused Anti-estrogen therapy     CMP Latest Ref Rng & Units 08/04/2017  Glucose 70 - 140 mg/dL 109  BUN 7 - 26 mg/dL 16  Creatinine 0.60 - 1.10 mg/dL 0.94  Sodium 136 - 145 mmol/L 141  Potassium 3.5 - 5.1 mmol/L 4.0  Chloride 98 - 109 mmol/L 104  CO2 22 - 29 mmol/L 30(H)  Calcium 8.4 - 10.4 mg/dL 9.6  Total Protein 6.4 - 8.3 g/dL 7.0  Total Bilirubin 0.2 - 1.2 mg/dL 0.4  Alkaline Phos 40 - 150 U/L 70  AST 5 - 34 U/L 25  ALT 0 - 55 U/L 25    Lab Results  Component Value Date   WBC 3.5 (L) 08/04/2017   HGB 14.1 08/04/2017   HCT 42.9 08/04/2017   MCV 87.4 08/04/2017   PLT 195 08/04/2017   NEUTROABS 2.0 08/04/2017      Assessment Plan:  Malignant neoplasm of upper-outer quadrant of right breast in female, estrogen receptor positive ([place]) 08/18/2017:Right 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  Based on the biopsy, 0.3 cm IDC grade 2 ER 100%, PR 100%, HER-2 negative ratio 1.72, Ki-67 60%.  T1 a N0 stage IA  1986: Left breast cancer treated with mastectomy and reconstruction followed by she is  in excellent physical health. Adjuvant chemotherapy and antiestrogen therapy with tamoxifen for 2 to 3 years Adjuvant radiation: 09/28/2017-10/19/2017  Recommendation: We recommended antiestrogen therapy but patient declined.  Hot flashes: tried and couldn't tolerate venlafaxine.  She is still suffering from hot flashes.  We discussed the pros and cons of gabapentin I sent a prescription for 100 mg of gabapentin to be taken at bedtime.  She is reluctant but is willing to give it a consideration.      Return to clinic in 1 year for  follow-up.  I discussed the assessment and treatment plan with the patient. The patient was provided an opportunity to ask questions and all were answered. The patient agreed with the plan and demonstrated an understanding of the instructions. The patient was advised to call back or seek an in-person evaluation if the symptoms worsen or if the condition fails to improve as anticipated.   I provided 20 minutes of face-to-face MyChart video visit time during this encounter.    Rulon Eisenmenger, MD 05/11/2019   I, Molly Dorshimer, am acting as scribe for Nicholas Lose, MD.  I have reviewed the above documentation for accuracy and completeness, and I agree with the above.

## 2019-05-11 ENCOUNTER — Inpatient Hospital Stay: Payer: Medicare Other | Attending: Hematology and Oncology | Admitting: Hematology and Oncology

## 2019-05-11 DIAGNOSIS — C50411 Malignant neoplasm of upper-outer quadrant of right female breast: Secondary | ICD-10-CM | POA: Diagnosis not present

## 2019-05-11 DIAGNOSIS — Z170 Estrogen receptor positive status [ER+]: Secondary | ICD-10-CM | POA: Diagnosis not present

## 2019-05-11 MED ORDER — GABAPENTIN 100 MG PO CAPS: 300.0000 mg | ORAL_CAPSULE | Freq: Every day | ORAL | 11 refills | Status: DC

## 2019-05-11 NOTE — Assessment & Plan Note (Signed)
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  Based on the biopsy, 0.3 cm IDC grade 2 ER 100%, PR 100%, HER-2 negative ratio 1.72, Ki-67 60%.  T1 a N0 stage IA  1986: Left breast cancer treated with mastectomy and reconstruction followed by she is in excellent physical health. Adjuvant chemotherapy and antiestrogen therapy with tamoxifen for 2 to 3 years Adjuvant radiation: 09/28/2017-10/19/2017  Recommendation: Antiestrogen therapy with letrozole 2.5 mg daily x5 years   Letrozole Toxicities 1. Hot flashes: tried and couldn't tolerate venlafaxine. Its better with time

## 2019-08-29 ENCOUNTER — Other Ambulatory Visit: Payer: Self-pay | Admitting: Hematology and Oncology

## 2019-08-29 ENCOUNTER — Telehealth: Payer: Self-pay | Admitting: *Deleted

## 2019-08-29 NOTE — Telephone Encounter (Signed)
Her symptoms of hot flashes are related to estrogen deprivation. Black cohosh can increase her risk of breast cancer and therefore we do not recommend it. However it is her decision if she wants to try it. We can consider increasing the dosage of gabapentin to reduce hot flashes or we can try Effexor once again.

## 2019-08-29 NOTE — Telephone Encounter (Signed)
Left message to call Dr Geralyn Flash nurse

## 2019-08-29 NOTE — Telephone Encounter (Signed)
Ana Coffey called to see if she can take [facility] for her hot flashes. States she has been told in the past that she cannot take it due to hx of breast cancer.  Also wants to know if at 76, would she still be producing estrogen? States she is having a terrible time with hot flashes.

## 2019-08-29 NOTE — Telephone Encounter (Signed)
Pt states she only took the gabapentin for 10 days. Did not get refilled. She will try if for at least 1 month and call us back to let us know how she is doing.

## 2019-10-12 DIAGNOSIS — E7800 Pure hypercholesterolemia, unspecified: Secondary | ICD-10-CM | POA: Diagnosis not present

## 2019-10-12 DIAGNOSIS — R232 Flushing: Secondary | ICD-10-CM | POA: Diagnosis not present

## 2019-10-12 DIAGNOSIS — Z7984 Long term (current) use of oral hypoglycemic drugs: Secondary | ICD-10-CM | POA: Diagnosis not present

## 2019-10-12 DIAGNOSIS — H43392 Other vitreous opacities, left eye: Secondary | ICD-10-CM | POA: Diagnosis not present

## 2019-10-12 DIAGNOSIS — Z853 Personal history of malignant neoplasm of breast: Secondary | ICD-10-CM | POA: Diagnosis not present

## 2019-10-12 DIAGNOSIS — E1169 Type 2 diabetes mellitus with other specified complication: Secondary | ICD-10-CM | POA: Diagnosis not present

## 2019-10-12 DIAGNOSIS — I10 Essential (primary) hypertension: Secondary | ICD-10-CM | POA: Diagnosis not present

## 2019-11-01 DIAGNOSIS — H2513 Age-related nuclear cataract, bilateral: Secondary | ICD-10-CM | POA: Diagnosis not present

## 2019-11-01 DIAGNOSIS — H43812 Vitreous degeneration, left eye: Secondary | ICD-10-CM | POA: Diagnosis not present

## 2019-11-01 DIAGNOSIS — E119 Type 2 diabetes mellitus without complications: Secondary | ICD-10-CM | POA: Diagnosis not present

## 2019-12-13 ENCOUNTER — Other Ambulatory Visit: Payer: Self-pay | Admitting: Obstetrics & Gynecology

## 2019-12-13 DIAGNOSIS — N6489 Other specified disorders of breast: Secondary | ICD-10-CM | POA: Diagnosis not present

## 2019-12-13 DIAGNOSIS — Z86000 Personal history of in-situ neoplasm of breast: Secondary | ICD-10-CM | POA: Diagnosis not present

## 2019-12-13 DIAGNOSIS — N63 Unspecified lump in unspecified breast: Secondary | ICD-10-CM

## 2019-12-13 DIAGNOSIS — Z01411 Encounter for gynecological examination (general) (routine) with abnormal findings: Secondary | ICD-10-CM | POA: Diagnosis not present

## 2019-12-13 DIAGNOSIS — L732 Hidradenitis suppurativa: Secondary | ICD-10-CM | POA: Diagnosis not present

## 2019-12-13 DIAGNOSIS — Z6831 Body mass index (BMI) 31.0-31.9, adult: Secondary | ICD-10-CM | POA: Diagnosis not present

## 2019-12-13 DIAGNOSIS — N951 Menopausal and female climacteric states: Secondary | ICD-10-CM | POA: Diagnosis not present

## 2019-12-14 ENCOUNTER — Other Ambulatory Visit: Payer: Self-pay | Admitting: Obstetrics & Gynecology

## 2019-12-14 DIAGNOSIS — N6489 Other specified disorders of breast: Secondary | ICD-10-CM

## 2019-12-15 ENCOUNTER — Other Ambulatory Visit: Payer: Self-pay | Admitting: Obstetrics & Gynecology

## 2019-12-15 ENCOUNTER — Ambulatory Visit
Admission: RE | Admit: 2019-12-15 | Discharge: 2019-12-15 | Disposition: A | Discharge status: Home / Self Care | Payer: Medicare Other | Source: Ambulatory Visit | Attending: Obstetrics & Gynecology | Admitting: Obstetrics & Gynecology

## 2019-12-15 ENCOUNTER — Other Ambulatory Visit: Payer: Self-pay

## 2019-12-15 DIAGNOSIS — N6489 Other specified disorders of breast: Secondary | ICD-10-CM

## 2019-12-15 DIAGNOSIS — R922 Inconclusive mammogram: Secondary | ICD-10-CM | POA: Diagnosis not present

## 2019-12-27 ENCOUNTER — Other Ambulatory Visit: Payer: Medicare Other

## 2020-01-06 ENCOUNTER — Ambulatory Visit: Payer: Medicare Other | Attending: Internal Medicine

## 2020-01-06 DIAGNOSIS — Z23 Encounter for immunization: Secondary | ICD-10-CM

## 2020-01-06 NOTE — Progress Notes (Signed)
   Covid-19 Vaccination Clinic  Name:  Ana Coffey    MRN: [id number] DOB: 1946-03-19  01/06/2020  Ana Coffey was observed post Covid-19 immunization for 15 minutes without incident. She was provided with Vaccine Information Sheet and instruction to access the V-Safe system.   Ana Coffey was instructed to call 911 with any severe reactions post vaccine: Marland Kitchen Difficulty breathing  . Swelling of face and throat  . A fast heartbeat  . A bad rash all over body  . Dizziness and weakness   Immunizations Administered    Name Date Dose VIS Date Route   Pfizer COVID-19 Vaccine 01/06/2020  1:04 PM 0.3 mL 05/17/2018 Intramuscular   Manufacturer: [place]   Lot: [id number]   [place]: [id number]

## 2020-01-10 DIAGNOSIS — N6489 Other specified disorders of breast: Secondary | ICD-10-CM | POA: Diagnosis not present

## 2020-01-10 DIAGNOSIS — Z86000 Personal history of in-situ neoplasm of breast: Secondary | ICD-10-CM | POA: Diagnosis not present

## 2020-01-10 DIAGNOSIS — L732 Hidradenitis suppurativa: Secondary | ICD-10-CM | POA: Diagnosis not present

## 2020-01-10 DIAGNOSIS — N951 Menopausal and female climacteric states: Secondary | ICD-10-CM | POA: Diagnosis not present

## 2020-01-12 DIAGNOSIS — N6459 Other signs and symptoms in breast: Secondary | ICD-10-CM | POA: Insufficient documentation

## 2020-04-11 DIAGNOSIS — Z1389 Encounter for screening for other disorder: Secondary | ICD-10-CM | POA: Diagnosis not present

## 2020-04-11 DIAGNOSIS — Z0000 Encounter for general adult medical examination without abnormal findings: Secondary | ICD-10-CM | POA: Diagnosis not present

## 2020-04-11 DIAGNOSIS — E1169 Type 2 diabetes mellitus with other specified complication: Secondary | ICD-10-CM | POA: Diagnosis not present

## 2020-04-11 DIAGNOSIS — Z7984 Long term (current) use of oral hypoglycemic drugs: Secondary | ICD-10-CM | POA: Diagnosis not present

## 2020-04-11 DIAGNOSIS — Z853 Personal history of malignant neoplasm of breast: Secondary | ICD-10-CM | POA: Diagnosis not present

## 2020-04-11 DIAGNOSIS — Z23 Encounter for immunization: Secondary | ICD-10-CM | POA: Diagnosis not present

## 2020-04-11 DIAGNOSIS — E7800 Pure hypercholesterolemia, unspecified: Secondary | ICD-10-CM | POA: Diagnosis not present

## 2020-04-11 DIAGNOSIS — I10 Essential (primary) hypertension: Secondary | ICD-10-CM | POA: Diagnosis not present

## 2020-04-11 DIAGNOSIS — R232 Flushing: Secondary | ICD-10-CM | POA: Diagnosis not present

## 2020-08-01 DIAGNOSIS — M25531 Pain in right wrist: Secondary | ICD-10-CM | POA: Diagnosis not present

## 2020-08-01 DIAGNOSIS — M25532 Pain in left wrist: Secondary | ICD-10-CM | POA: Diagnosis not present

## 2020-08-01 DIAGNOSIS — M545 Low back pain, unspecified: Secondary | ICD-10-CM | POA: Diagnosis not present

## 2020-10-09 DIAGNOSIS — Z853 Personal history of malignant neoplasm of breast: Secondary | ICD-10-CM | POA: Diagnosis not present

## 2020-10-09 DIAGNOSIS — Z7984 Long term (current) use of oral hypoglycemic drugs: Secondary | ICD-10-CM | POA: Diagnosis not present

## 2020-10-09 DIAGNOSIS — I10 Essential (primary) hypertension: Secondary | ICD-10-CM | POA: Diagnosis not present

## 2020-10-09 DIAGNOSIS — E1169 Type 2 diabetes mellitus with other specified complication: Secondary | ICD-10-CM | POA: Diagnosis not present

## 2020-10-09 DIAGNOSIS — R232 Flushing: Secondary | ICD-10-CM | POA: Diagnosis not present

## 2020-12-17 DIAGNOSIS — Z853 Personal history of malignant neoplasm of breast: Secondary | ICD-10-CM | POA: Diagnosis not present

## 2020-12-17 DIAGNOSIS — R922 Inconclusive mammogram: Secondary | ICD-10-CM | POA: Diagnosis not present

## 2020-12-20 DIAGNOSIS — N6489 Other specified disorders of breast: Secondary | ICD-10-CM | POA: Diagnosis not present

## 2020-12-20 DIAGNOSIS — Z86000 Personal history of in-situ neoplasm of breast: Secondary | ICD-10-CM | POA: Diagnosis not present

## 2020-12-20 DIAGNOSIS — Z01411 Encounter for gynecological examination (general) (routine) with abnormal findings: Secondary | ICD-10-CM | POA: Diagnosis not present

## 2020-12-20 DIAGNOSIS — N951 Menopausal and female climacteric states: Secondary | ICD-10-CM | POA: Diagnosis not present

## 2020-12-20 DIAGNOSIS — Z124 Encounter for screening for malignant neoplasm of cervix: Secondary | ICD-10-CM | POA: Diagnosis not present

## 2020-12-20 DIAGNOSIS — B373 Candidiasis of vulva and vagina: Secondary | ICD-10-CM | POA: Diagnosis not present

## 2020-12-20 DIAGNOSIS — Z6831 Body mass index (BMI) 31.0-31.9, adult: Secondary | ICD-10-CM | POA: Diagnosis not present

## 2020-12-31 DIAGNOSIS — Z23 Encounter for immunization: Secondary | ICD-10-CM | POA: Diagnosis not present

## 2021-10-16 IMAGING — MG MM DIGITAL DIAGNOSTIC UNILAT*R* W/ TOMO W/ CAD
7 series · 8 of 19 positions shown · non-contrast
Comparison: Previous exam(s).

CLINICAL DATA: 74-year-old female with history of right breast
cancer status post lumpectomy and radiation therapy in 7424.
Patient's physician is concerned about skin thickening in the 8
o'clock region of the breast with possible peau de [REDACTED]. Patient
states the area is clinically stable. History of right reduction
mammoplasty. Patient is status post left mastectomy.

EXAM:
DIGITAL DIAGNOSTIC RIGHT MAMMOGRAM WITH CAD AND TOMO
ULTRASOUND RIGHT BREAST

[R MLO]
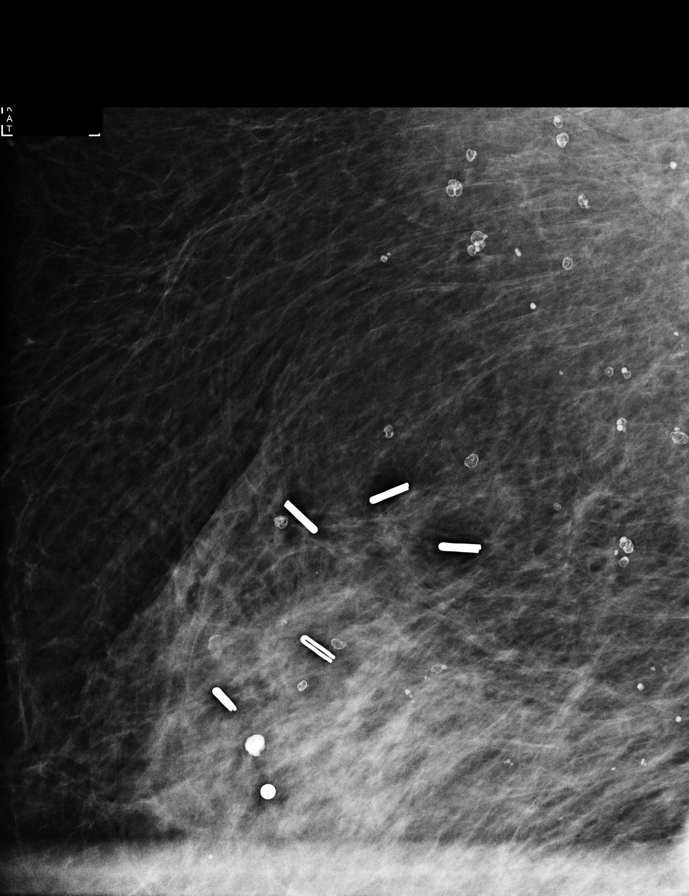

[R CC synth-2D]
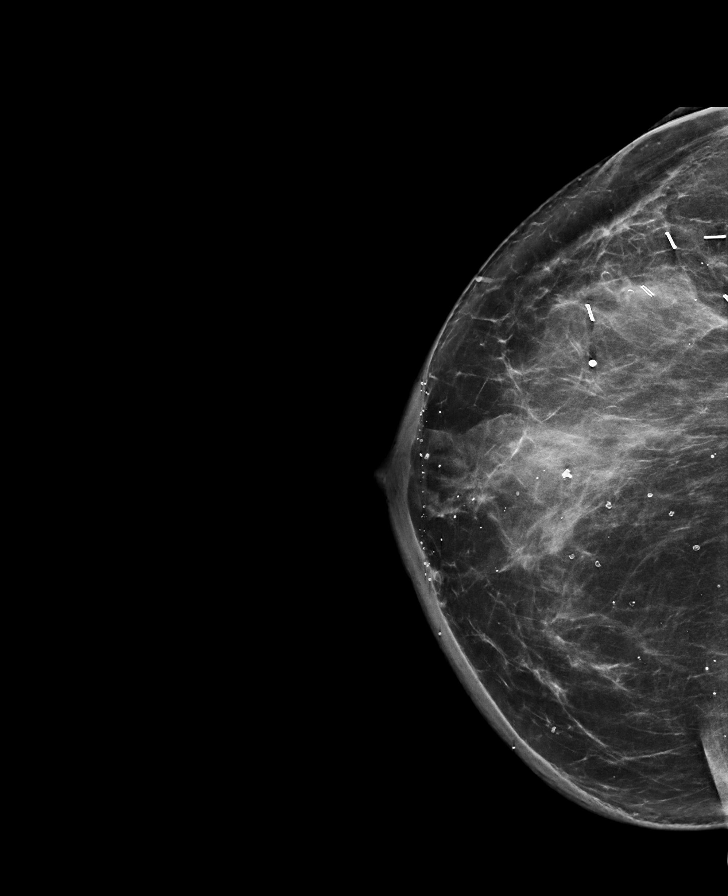

[R TAN synth-2D]
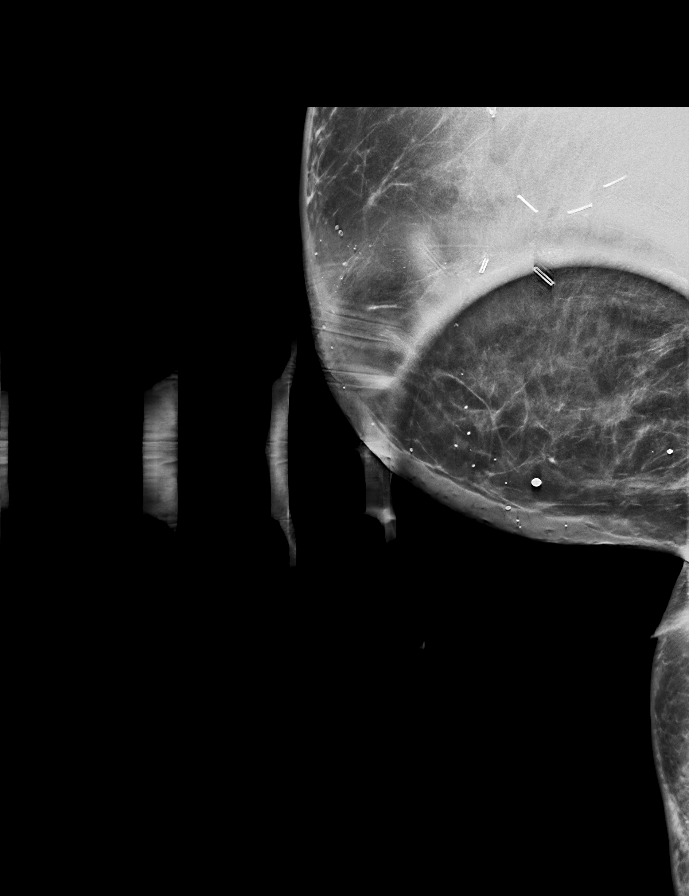

[R MLO synth-2D]
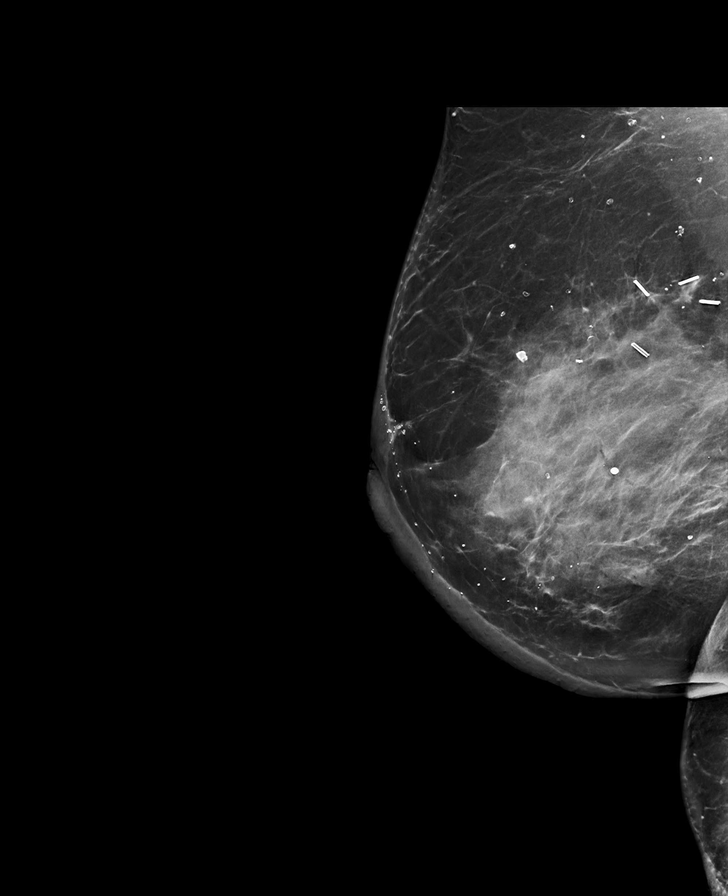

[R TAN tomo · 2 of 86 frames shown]
[frame 28/86]
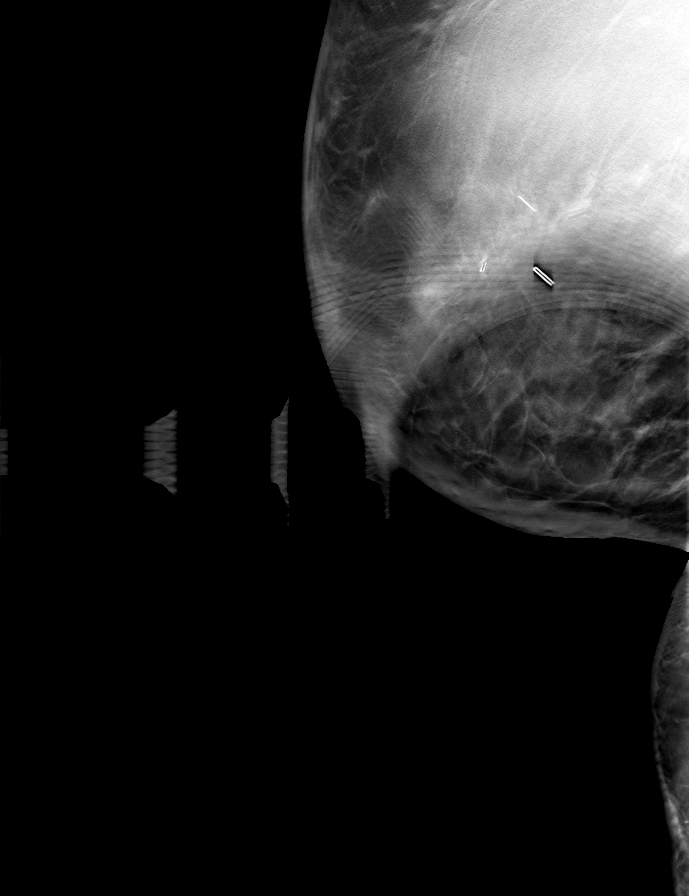
[frame 43/86]
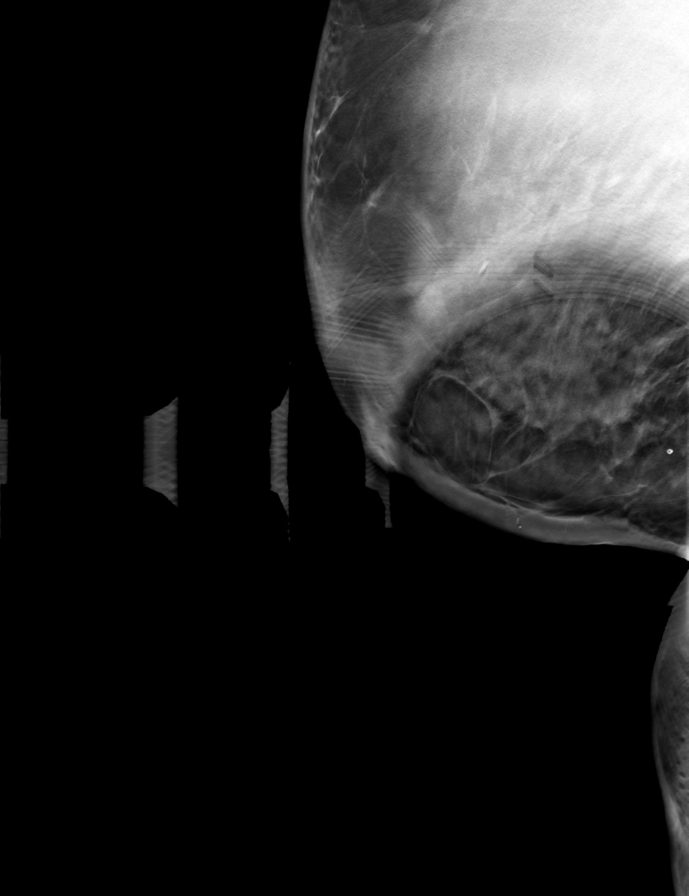

[R CC tomo · tomo slice 45/90.0]
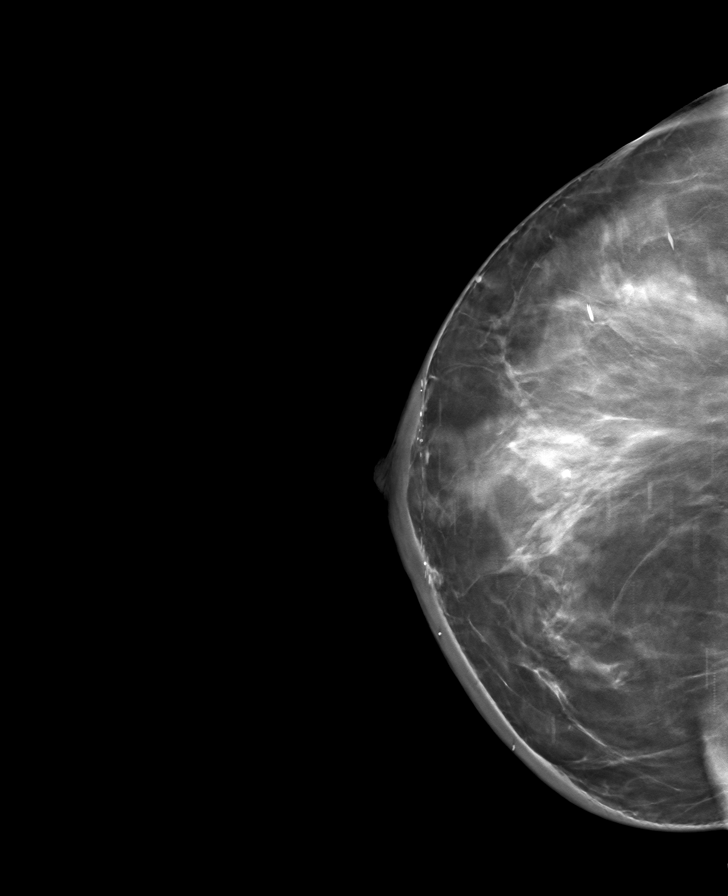

[R MLO tomo · tomo slice 49/97.0]
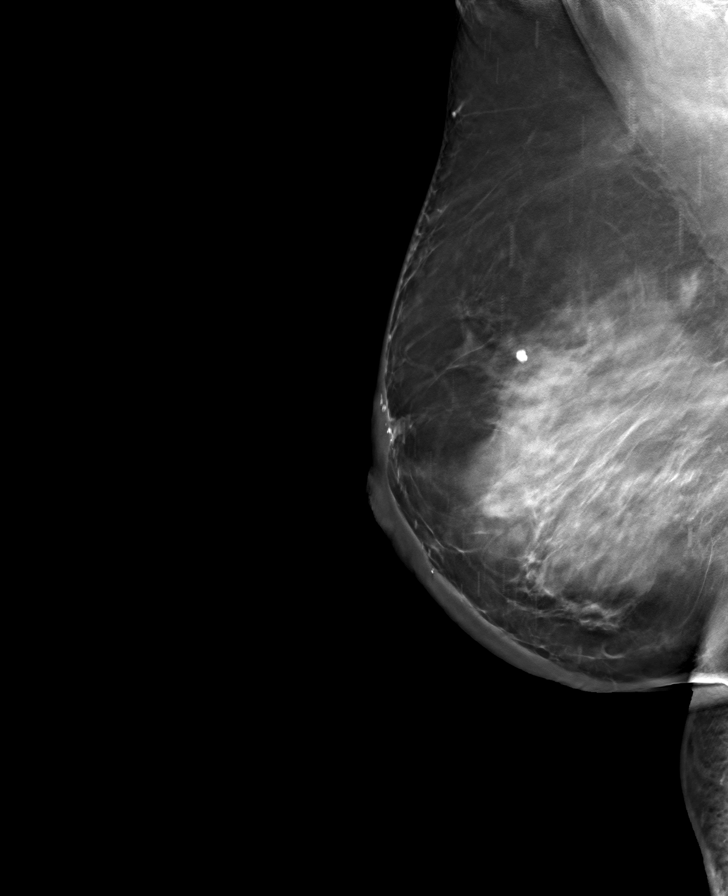

[8 of 19 positions shown; findings below may reference images not displayed]

ACR Breast Density Category c: The breast tissue is heterogeneously
dense, which may obscure small masses.
FINDINGS: Stable lumpectomy changes are seen in the upper-outer quadrant of
the breast. There is diffuse skin thickening that has a similar
appearance to prior exams dated 09/06/2018 and 09/12/2019. No
suspicious mass or malignant type microcalcifications identified.

Mammographic images were processed with CAD.

On physical exam, there is diffuse thickening of the skin. I do not
palpate a mass in the area of clinical concern in the right breast
at 8 o'clock 7 cm from the nipple.

Targeted ultrasound is performed, showing diffuse skin thickening is
seen in the 8 o'clock region of the right breast. No suspicious mass
identified.
IMPRESSION: No imaging findings of malignancy in the right breast.

RECOMMENDATION:
Diagnostic right mammogram in 1 year is recommended.

If there is clinical concern about the right breast skin thickening
further evaluation with MRI or skin punch biopsy would be
recommended.

I have discussed the findings and recommendations with the patient.
If applicable, a reminder letter will be sent to the patient
regarding the next appointment.

BI-RADS CATEGORY  2: Benign.

## 2021-10-16 IMAGING — US US BREAST*R* LIMITED INC AXILLA
1 series · 2 of 2 positions shown · non-contrast
Comparison: Previous exam(s).

CLINICAL DATA: 74-year-old female with history of right breast
cancer status post lumpectomy and radiation therapy in 7424.
Patient's physician is concerned about skin thickening in the 8
o'clock region of the breast with possible peau de [REDACTED]. Patient
states the area is clinically stable. History of right reduction
mammoplasty. Patient is status post left mastectomy.

EXAM:
DIGITAL DIAGNOSTIC RIGHT MAMMOGRAM WITH CAD AND TOMO
ULTRASOUND RIGHT BREAST

[Series 1: us breast*right* limited inc axilla · 0.08mm/px · 2 of 2 slices shown]
[im 1/2]
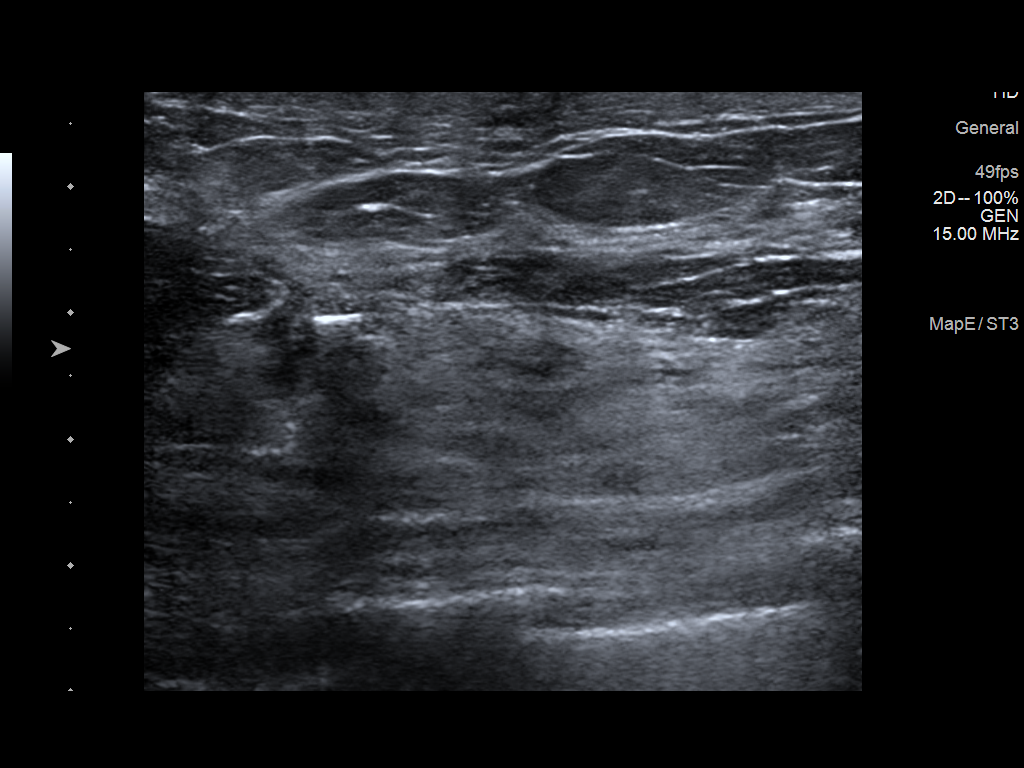
[im 2/2]
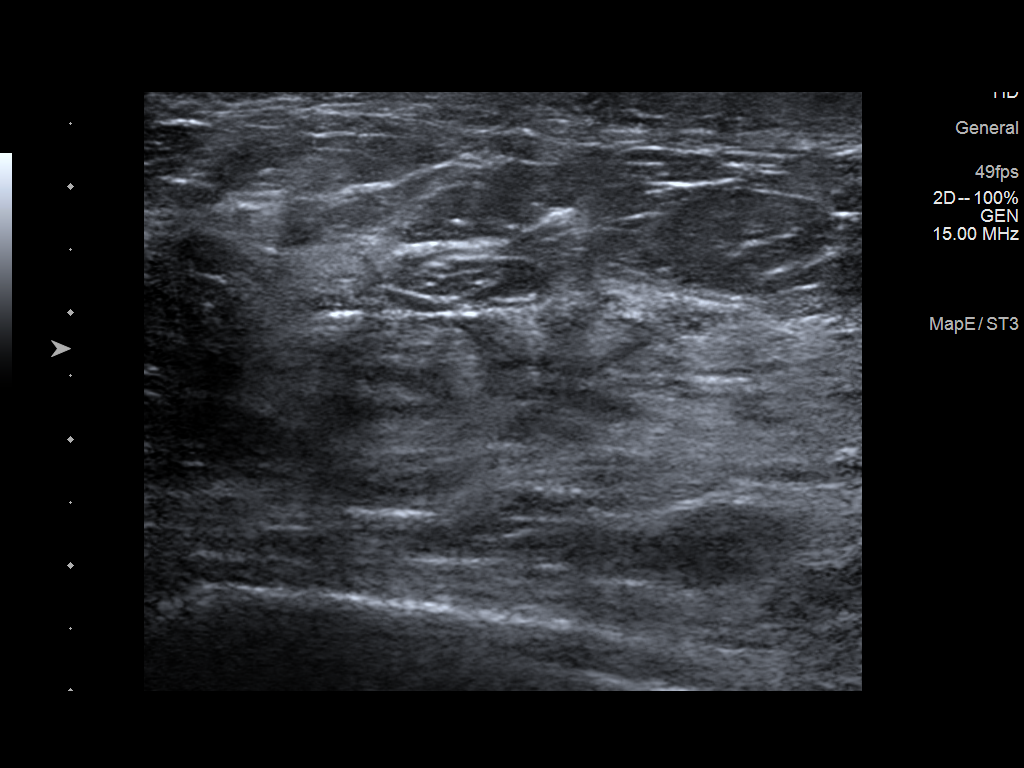

[2 of 2 positions shown; findings below may reference images not displayed]

ACR Breast Density Category c: The breast tissue is heterogeneously
dense, which may obscure small masses.
FINDINGS: Stable lumpectomy changes are seen in the upper-outer quadrant of
the breast. There is diffuse skin thickening that has a similar
appearance to prior exams dated 09/06/2018 and 09/12/2019. No
suspicious mass or malignant type microcalcifications identified.

Mammographic images were processed with CAD.

On physical exam, there is diffuse thickening of the skin. I do not
palpate a mass in the area of clinical concern in the right breast
at 8 o'clock 7 cm from the nipple.

Targeted ultrasound is performed, showing diffuse skin thickening is
seen in the 8 o'clock region of the right breast. No suspicious mass
identified.
IMPRESSION: No imaging findings of malignancy in the right breast.

RECOMMENDATION:
Diagnostic right mammogram in 1 year is recommended.

If there is clinical concern about the right breast skin thickening
further evaluation with MRI or skin punch biopsy would be
recommended.

I have discussed the findings and recommendations with the patient.
If applicable, a reminder letter will be sent to the patient
regarding the next appointment.

BI-RADS CATEGORY  2: Benign.

## 2022-03-26 ENCOUNTER — Ambulatory Visit (INDEPENDENT_AMBULATORY_CARE_PROVIDER_SITE_OTHER): Payer: Medicare Other

## 2022-03-26 ENCOUNTER — Ambulatory Visit
Admission: EM | Admit: 2022-03-26 | Discharge: 2022-03-26 | Disposition: A | Discharge status: Home / Self Care | Payer: Medicare Other | Attending: Internal Medicine | Admitting: Internal Medicine

## 2022-03-26 DIAGNOSIS — J101 Influenza due to other identified influenza virus with other respiratory manifestations: Secondary | ICD-10-CM | POA: Diagnosis present

## 2022-03-26 DIAGNOSIS — R509 Fever, unspecified: Secondary | ICD-10-CM

## 2022-03-26 DIAGNOSIS — R051 Acute cough: Secondary | ICD-10-CM

## 2022-03-26 DIAGNOSIS — R059 Cough, unspecified: Secondary | ICD-10-CM | POA: Diagnosis not present

## 2022-03-26 LAB — POCT URINALYSIS DIP (MANUAL ENTRY)
Blood, UA: NEGATIVE
Glucose, UA: NEGATIVE mg/dL
Ketones, POC UA: NEGATIVE mg/dL
Nitrite, UA: NEGATIVE
Protein Ur, POC: NEGATIVE mg/dL
Spec Grav, UA: 1.025 (ref 1.010–1.025)
Urobilinogen, UA: 0.2 E.U./dL
pH, UA: 5.5 (ref 5.0–8.0)

## 2022-03-26 LAB — POCT INFLUENZA A/B
Influenza A, POC: POSITIVE — AB
Influenza B, POC: NEGATIVE

## 2022-03-26 MED ORDER — ACETAMINOPHEN 325 MG PO TABS
650.0000 mg | ORAL_TABLET | Freq: Once | ORAL | Status: AC
  Administered 2022-03-26: 650 mg via ORAL

## 2022-03-26 MED ORDER — OSELTAMIVIR PHOSPHATE 75 MG PO CAPS: 75.0000 mg | ORAL_CAPSULE | Freq: Two times a day (BID) | ORAL | 0 refills | Status: DC

## 2022-03-26 MED ORDER — BENZONATATE 100 MG PO CAPS: 100.0000 mg | ORAL_CAPSULE | Freq: Three times a day (TID) | ORAL | 0 refills | Status: DC

## 2022-03-26 NOTE — ED Triage Notes (Signed)
Pt c/o sneezing, watery eyes, nasal drainage, fatigue   Denies cough, sore throat, ear aches, headaches, body aches, known covid or flu exposure.   Onset ~ 2 days ago

## 2022-03-26 NOTE — Discharge Instructions (Addendum)
You tested positive for influenza A.  Start Tamiflu twice daily for 5 days.  Use Tessalon for cough.  Alternate Tylenol and ibuprofen for fever.  Make sure that you are drinking plenty of fluid.  If your symptoms or not improving within a week please return for reevaluation.  If anything worsens and you have high fever not responding to medication, chest pain, shortness of breath, worsening cough, nausea/vomiting interfere with oral intake you need to be seen immediately.

## 2022-03-26 NOTE — ED Provider Notes (Signed)
[facility]    CSN: [id number] Arrival date & time: 03/26/22  1133      History   Chief Complaint Chief Complaint  Patient presents with   Fever    HPI Ana Coffey is a 77 y.o. female.   Patient presents today with a 2-day history of URI symptoms including nasal congestion, sinus pressure, cough.  Denies any chest pain, shortness of breath, nausea, vomiting.  She is experiencing a fever.  She has tried Claritin-D without improvement of symptoms.  She does have a history of allergies and initially thought symptoms were related to her allergies, however, she has had worsening symptoms including fever prompting evaluation today.  She does have a history of breast cancer but has not required active treatment since 2019.  She denies history of asthma, COPD, smoking.  She does have a history of diabetes but reports her blood sugars adequately controlled.  Denies any recent antibiotics or steroids.  She has not had influenza vaccine.  She has had COVID vaccines but has not had most recent booster.  She has had COVID several months ago.    Past Medical History:  Diagnosis Date   Allergy    Breast cancer, left breast ([place]) 1986   Candida vaginitis    Diabetes mellitus without complication ([place])    Fibroids    Hypertension    Menopausal symptoms    Pelvic relaxation    Personal history of radiation therapy 2019   Postmenopausal bleeding     Patient Active Problem List   Diagnosis Date Noted   Genetic testing 08/06/2017   Malignant neoplasm of upper-outer quadrant of right breast in female, estrogen receptor positive ([place]) 07/30/2017   Obesity 08/04/2011   Decreased libido 06/17/2010    Class: History of   Pelvic relaxation 04/07/2000    Class: History of   Menopausal symptom 04/16/1992    Class: History of   Breast cancer, left breast ([place]) 03/05/1986    Class: History of    Past Surgical History:  Procedure Laterality Date   BREAST LUMPECTOMY Right  07/2017   BREAST LUMPECTOMY WITH RADIOACTIVE SEED AND SENTINEL LYMPH NODE BIOPSY Right 08/18/2017   Procedure: BREAST LUMPECTOMY WITH RADIOACTIVE SEED AND DEEP AXILLARY SENTINEL LYMPH NODE BIOPSY WITH BLUE DYE INJECTION;  Surgeon: Fanny Skates, MD;  Location: [facility];  Service: General;  Laterality: Right;   MASTECTOMY  1986   left breast   REDUCTION MAMMAPLASTY Right     OB History   No obstetric history on file.      Home Medications    Prior to Admission medications   Medication Sig Start Date End Date Taking? Authorizing Provider  benzonatate (TESSALON) 100 MG capsule Take 1 capsule (100 mg total) by mouth every 8 (eight) hours. 03/26/22  Yes Quentyn Kolbeck, Derry Skill, PA-C  oseltamivir (TAMIFLU) 75 MG capsule Take 1 capsule (75 mg total) by mouth every 12 (twelve) hours. 03/26/22  Yes Willow Reczek, Derry Skill, PA-C  Cholecalciferol (VITAMIN D3) 1000 units CAPS Take by mouth daily.    [provider]  Cinnamon 500 MG TABS Take by mouth.    [provider]  hydrochlorothiazide (HYDRODIURIL) 25 MG tablet Take 25 mg by mouth daily.    [provider]  metFORMIN (GLUCOPHAGE) 500 MG tablet  07/30/17   [provider]  multivitamin-iron-minerals-folic acid (CENTRUM) chewable tablet Chew 1 tablet by mouth daily.    [provider]  OVER THE COUNTER MEDICATION Magnesium 250 mg daily  per patient    [provider]  simvastatin (ZOCOR) 10 MG tablet  07/02/17   [provider]  Turmeric Curcumin 500 MG CAPS Take 500 mg by mouth. 2 per day    [provider]  vitamin B-12 (CYANOCOBALAMIN) 1000 MCG tablet Take 5,000 mcg by mouth.    [provider]  zinc gluconate 50 MG tablet Take 50 mg by mouth daily.    [provider]    Family History Family History  Problem Relation Age of Onset   Cancer Mother    Breast cancer Mother    Breast cancer Brother     Social History Social History   Tobacco Use    Smoking status: Never   Smokeless tobacco: Never  Vaping Use   Vaping Use: Never used  Substance Use Topics   Alcohol use: Yes    Comment: Drinks 1 drink a week   Drug use: No     Allergies   Patient has no known allergies.   Review of Systems Review of Systems  Constitutional:  Positive for activity change, fatigue and fever. Negative for appetite change.  HENT:  Positive for congestion and sore throat. Negative for postnasal drip, sinus pressure and sneezing.   Respiratory:  Positive for cough. Negative for shortness of breath.   Cardiovascular:  Negative for chest pain.  Gastrointestinal:  Negative for abdominal pain, diarrhea, nausea and vomiting.  Neurological:  Negative for dizziness, light-headedness and headaches.     Physical Exam Triage Vital Signs ED Triage Vitals [03/26/22 1147]  Enc Vitals Group     BP (!) 147/86     Pulse Rate (!) 115     Resp 16     Temp (!) 102.3 F (39.1 C)     Temp Source Oral     SpO2 95 %     Weight      Height      Head Circumference      Peak Flow      Pain Score 0     Pain Loc      Pain Edu?      Excl. in [place]?    No data found.  Updated Vital Signs BP (!) 147/86 (BP Location: Left Arm)   Pulse (!) 107   Temp 99.2 F (37.3 C) (Oral)   Resp 16   SpO2 95%   Visual Acuity Right Eye Distance:   Left Eye Distance:   Bilateral Distance:    Right Eye Near:   Left Eye Near:    Bilateral Near:     Physical Exam Vitals reviewed.  Constitutional:      General: She is awake. She is not in acute distress.    Appearance: Normal appearance. She is well-developed. She is not ill-appearing.     Comments: Very pleasant female appears stated age in no acute distress sitting comfortably in exam room  HENT:     Head: Normocephalic and atraumatic.     Right Ear: Tympanic membrane, ear canal and external ear normal. Tympanic membrane is not erythematous or bulging.     Left Ear: Tympanic membrane, ear canal and external ear  normal. Tympanic membrane is not erythematous or bulging.     Nose:     Right Sinus: No maxillary sinus tenderness or frontal sinus tenderness.     Left Sinus: No maxillary sinus tenderness or frontal sinus tenderness.     Mouth/Throat:     Pharynx: Uvula midline. No oropharyngeal exudate or posterior  oropharyngeal erythema.  Cardiovascular:     Rate and Rhythm: Normal rate and regular rhythm.     Heart sounds: Normal heart sounds, S1 normal and S2 normal. No murmur heard. Pulmonary:     Effort: Pulmonary effort is normal.     Breath sounds: Normal breath sounds. No wheezing, rhonchi or rales.     Comments: Clear to auscultation bilaterally Psychiatric:        Behavior: Behavior is cooperative.      UC Treatments / Results  Labs (all labs ordered are listed, but only abnormal results are displayed) Labs Reviewed  POCT INFLUENZA A/B - Abnormal; Notable for the following components:      Result Value   Influenza A, POC Positive (*)    All other components within normal limits  POCT URINALYSIS DIP (MANUAL ENTRY) - Abnormal; Notable for the following components:   Clarity, UA cloudy (*)    Bilirubin, UA small (*)    Leukocytes, UA Trace (*)    All other components within normal limits  URINE CULTURE    EKG   Radiology DG Chest 2 View  Result Date: 03/26/2022 CLINICAL DATA:  Cough and fever EXAM: CHEST - 2 VIEW COMPARISON:  Chest 10/05/2005 FINDINGS: The heart size and mediastinal contours are within normal limits. Both lungs are clear. The visualized skeletal structures are unremarkable. Left mastectomy. Surgical clips left axilla. Surgical clips in the right breast. IMPRESSION: No active cardiopulmonary disease. Electronically Signed   By: Franchot Gallo M.D.   On: 03/26/2022 12:52    Procedures Procedures (including critical care time)  Medications Ordered in UC Medications  acetaminophen (TYLENOL) tablet 650 mg (650 mg Oral Given 03/26/22 1150)    Initial Impression /  Assessment and Plan / UC Course  I have reviewed the triage vital signs and the nursing notes.  Pertinent labs & imaging results that were available during my care of the patient were reviewed by me and considered in my medical decision making (see chart for details).     Patient was febrile and tachycardic on intake but given Tylenol with normalization of vital signs.  She tested positive for influenza A.  She is within 48 hours of symptom onset so we will start Tamiflu twice daily for 5 days.  Chest x-ray was obtained that showed no acute cardiopulmonary disease.  Urine was also obtained which did have some leukocytes.  Patient denies any significant urinary symptoms but given abnormalities with high fever will culture this to ensure there is not an occult UTI causing symptoms.  Will defer antibiotic treatment until results are available.  She is to use Tessalon for cough.  Recommended that she rest and drink plenty of fluid.  Discussed that if her symptoms or not improving within a week she is to return for reevaluation.  If anything worsens and she has high fever not responding medication, chest pain, shortness of breath, nausea/vomiting interfrontal intake, weakness she needs to be seen immediately.  Strict return precautions given.  Final Clinical Impressions(s) / UC Diagnoses   Final diagnoses:  Fever, unspecified  Influenza A  Acute cough     Discharge Instructions      You tested positive for influenza A.  Start Tamiflu twice daily for 5 days.  Use Tessalon for cough.  Alternate Tylenol and ibuprofen for fever.  Make sure that you are drinking plenty of fluid.  If your symptoms or not improving within a week please return for reevaluation.  If anything worsens and  you have high fever not responding to medication, chest pain, shortness of breath, worsening cough, nausea/vomiting interfere with oral intake you need to be seen immediately.     ED Prescriptions     Medication Sig  Dispense Auth. Provider   oseltamivir (TAMIFLU) 75 MG capsule Take 1 capsule (75 mg total) by mouth every 12 (twelve) hours. 10 capsule Deamber Buckhalter K, PA-C   benzonatate (TESSALON) 100 MG capsule Take 1 capsule (100 mg total) by mouth every 8 (eight) hours. 21 capsule Aiman Sonn K, PA-C      PDMP not reviewed this encounter.   Terrilee Croak, PA-C 03/26/22 1304

## 2022-03-29 LAB — URINE CULTURE

## 2022-09-15 ENCOUNTER — Ambulatory Visit
Admission: EM | Admit: 2022-09-15 | Discharge: 2022-09-15 | Disposition: A | Discharge status: Home / Self Care | Payer: Medicare Other | Attending: Internal Medicine | Admitting: Internal Medicine

## 2022-09-15 DIAGNOSIS — T7840XA Allergy, unspecified, initial encounter: Secondary | ICD-10-CM | POA: Diagnosis not present

## 2022-09-15 DIAGNOSIS — T63441A Toxic effect of venom of bees, accidental (unintentional), initial encounter: Secondary | ICD-10-CM | POA: Diagnosis not present

## 2022-09-15 MED ORDER — TRIAMCINOLONE ACETONIDE 0.1 % EX CREA: 1.0000 | TOPICAL_CREAM | Freq: Two times a day (BID) | CUTANEOUS | 0 refills | Status: AC

## 2022-09-15 NOTE — ED Triage Notes (Signed)
Pt c/o bee sting to the right hand on the pinky and ring fingers, swelling and redness on both digits. X 2 days no other sx's

## 2022-09-15 NOTE — ED Provider Notes (Signed)
[facility]    CSN: [id number] Arrival date & time: 09/15/22  1100      History   Chief Complaint No chief complaint on file.   HPI Ana Coffey is a 77 y.o. female.   Patient presents with 2 different bee stings to the right fourth and fifth digits that occurred about 2 days ago.  Patient denies any feelings of throat closing or shortness of breath.  Reports that she has applied calamine lotion and cool compresses with minimal improvement.  Reports it is very itchy and the swelling seems to be spreading into the dorsal surface of her hand.     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   Obesity 08/04/2011   Decreased libido 06/17/2010    Class: History of   Pelvic relaxation 04/07/2000    Class: History of   Menopausal symptom 04/16/1992    Class: History of   Breast cancer, left breast (HCC) 03/05/1986    Class: History of    Past Surgical History:  Procedure Laterality Date   BREAST LUMPECTOMY Right 07/2017   BREAST LUMPECTOMY WITH RADIOACTIVE SEED AND SENTINEL LYMPH NODE BIOPSY Right 08/18/2017   Procedure: BREAST LUMPECTOMY WITH RADIOACTIVE SEED AND DEEP AXILLARY SENTINEL LYMPH NODE BIOPSY WITH BLUE DYE INJECTION;  Surgeon: Claud Kelp, MD;  Location: [facility];  Service: General;  Laterality: Right;   MASTECTOMY  1986   left breast   REDUCTION MAMMAPLASTY Right     OB History   No obstetric history on file.      Home Medications    Prior to Admission  medications   Medication Sig Start Date End Date Taking? Authorizing Provider  triamcinolone cream (KENALOG) 0.1 % Apply 1 Application topically 2 (two) times daily. Apply very thin 09/15/22  Yes Shavette Shoaff, [place] E, FNP  benzonatate (TESSALON) 100 MG capsule Take 1 capsule (100 mg total) by mouth every 8 (eight) hours. 03/26/22   Raspet, Noberto Retort, PA-C  Cholecalciferol (VITAMIN D3) 1000 units CAPS Take by mouth daily.    [provider]  Cinnamon 500 MG TABS Take by mouth.    [provider]  hydrochlorothiazide (HYDRODIURIL) 25 MG tablet Take 25 mg by mouth daily.    [provider]  metFORMIN (GLUCOPHAGE) 500 MG tablet  07/30/17   [provider]  multivitamin-iron-minerals-folic acid (CENTRUM) chewable tablet Chew 1 tablet by mouth daily.    [provider]  oseltamivir (TAMIFLU) 75 MG capsule Take 1 capsule (75 mg total) by mouth every 12 (twelve) hours. 03/26/22   Raspet, Noberto Retort, PA-C  OVER THE COUNTER MEDICATION Magnesium 250 mg daily per patient    [provider]  simvastatin (ZOCOR) 10 MG tablet  07/02/17   [provider]  Turmeric Curcumin 500 MG CAPS Take 500 mg by mouth. 2 per day    [provider]  vitamin B-12 (CYANOCOBALAMIN) 1000 MCG tablet Take 5,000 mcg by mouth.    [provider]  zinc gluconate 50 MG tablet Take 50 mg by mouth daily.    [provider]    Family History Family History  Problem Relation Age of Onset   Cancer Mother    Breast cancer Mother    Breast cancer Brother     Social History Social History   Tobacco Use   Smoking status: Never   Smokeless tobacco: Never  Vaping Use   Vaping Use: Never used  Substance Use Topics   Alcohol use: Yes    Comment: Drinks 1 drink a week   Drug use: No     Allergies   Patient has no known allergies.   Review of Systems Review of Systems Per HPI  Physical Exam Triage Vital Signs ED Triage Vitals  Enc Vitals Group     BP  09/15/22 1117 (!) 154/84     Pulse Rate 09/15/22 1117 75     Resp 09/15/22 1117 13     Temp 09/15/22 1117 97.7 F (36.5 C)     Temp Source 09/15/22 1117 Oral     SpO2 09/15/22 1117 95 %     Weight --      Height --      Head Circumference --      Peak Flow --      Pain Score 09/15/22 1121 7     Pain Loc --      Pain Edu? --      Excl. in GC? --    No data found.  Updated Vital Signs BP (!) 154/84 (BP Location: Left Arm)   Pulse 75   Temp 97.7 F (36.5 C) (Oral)   Resp 13   SpO2 95%   Visual Acuity Right Eye Distance:   Left Eye Distance:   Bilateral Distance:    Right Eye Near:   Left Eye Near:    Bilateral Near:     Physical Exam Constitutional:      General: She is not in acute distress.    Appearance: Normal appearance. She is not toxic-appearing or diaphoretic.  HENT:     Head: Normocephalic and atraumatic.  Eyes:     Extraocular Movements: Extraocular movements intact.     Conjunctiva/sclera: Conjunctivae normal.  Pulmonary:     Effort: Pulmonary effort is normal.  Skin:    Comments: Patient has some mild swelling and mild erythema present throughout the right fourth and fifth digits that extends slightly into the dorsal surface of the hand.  Patient has full range of motion of fingers and appears neurovascularly intact.  Neurological:     General: No focal deficit present.     Mental Status: She is alert and oriented to person, place, and time. Mental status is at baseline.  Psychiatric:        Mood and Affect: Mood normal.        Behavior: Behavior normal.        Thought Content: Thought content normal.        Judgment: Judgment normal.      UC Treatments / Results  Labs (all labs ordered are listed, but only abnormal results are displayed) Labs Reviewed - No data to display  EKG   Radiology No results found.  Procedures Procedures (including critical care time)  Medications Ordered in UC Medications - No data to display  Initial  Impression / Assessment and Plan / UC Course  I have reviewed the triage vital signs and the nursing notes.  Pertinent labs &  imaging results that were available during my care of the patient were reviewed by me and considered in my medical decision making (see chart for details).     Patient has a localized allergic reaction to bee sting.  No concern for cellulitis or anaphylaxis on exam.  Advised patient to continue cool compresses will prescribe low-dose triamcinolone cream to help alleviate itching and inflammation.  Also advised for patient to take antihistamines as well. Advised low dose benadryl or cetirizine if benadryl makes her drowsy. Advised her to follow-up if any symptoms persist or worsen.  Patient verbalized understanding and was agreeable with plan. Final Clinical Impressions(s) / UC Diagnoses   Final diagnoses:  Bee sting, accidental or unintentional, initial encounter  Allergic reaction, initial encounter     Discharge Instructions      Watch closely for any worsening symptoms and follow-up they occur.  Use cool compresses as we discussed.  I have prescribed you a topical cream that you will use sparingly for itching and discomfort.  You may take Benadryl or Zyrtec if Benadryl makes you drowsy.    ED Prescriptions     Medication Sig Dispense Auth. Provider   triamcinolone cream (KENALOG) 0.1 % Apply 1 Application topically 2 (two) times daily. Apply very thin 30 g Gustavus Bryant, [place]      PDMP not reviewed this encounter.   Gustavus Bryant, [place] 09/15/22 1202

## 2022-09-15 NOTE — Discharge Instructions (Addendum)
Watch closely for any worsening symptoms and follow-up they occur.  Use cool compresses as we discussed.  I have prescribed you a topical cream that you will use sparingly for itching and discomfort.  You may take Benadryl or Zyrtec if Benadryl makes you drowsy.

## 2023-01-11 DIAGNOSIS — R0989 Other specified symptoms and signs involving the circulatory and respiratory systems: Secondary | ICD-10-CM | POA: Insufficient documentation

## 2023-08-04 ENCOUNTER — Ambulatory Visit: Admission: EM | Admit: 2023-08-04 | Discharge: 2023-08-04 | Disposition: A | Discharge status: Home / Self Care

## 2023-08-04 DIAGNOSIS — C50919 Malignant neoplasm of unspecified site of unspecified female breast: Secondary | ICD-10-CM | POA: Insufficient documentation

## 2023-08-04 DIAGNOSIS — L255 Unspecified contact dermatitis due to plants, except food: Secondary | ICD-10-CM

## 2023-08-04 DIAGNOSIS — I10 Essential (primary) hypertension: Secondary | ICD-10-CM | POA: Insufficient documentation

## 2023-08-04 DIAGNOSIS — F520 Hypoactive sexual desire disorder: Secondary | ICD-10-CM | POA: Insufficient documentation

## 2023-08-04 MED ORDER — PREDNISONE 20 MG PO TABS: 20.0000 mg | ORAL_TABLET | Freq: Every day | ORAL | 0 refills | Status: AC

## 2023-08-04 NOTE — ED Triage Notes (Signed)
"  I am breaking out in various areas, I have been working outside recently" (? Poison ivy/oak).

## 2023-08-04 NOTE — Discharge Instructions (Signed)
 Take prednisone 20mg  once daily for the next 5 days.  Avoid scratching the sites. Watch for signs of infection such as redness, swelling, pus, or pain.   If you develop any new or worsening symptoms or if your symptoms do not start to improve, please return here or follow-up with your primary care provider. If your symptoms are severe, please go to the emergency room.

## 2023-08-04 NOTE — ED Provider Notes (Signed)
 [facility]    CSN: [id number] Arrival date & time: 08/04/23  1158      History   Chief Complaint Chief Complaint  Patient presents with   Rash    HPI Ana Coffey is a 78 y.o. female.   Patient presents to urgent care for evaluation of rash to the bilateral elbows, bilateral neck, bilateral lower abdomen, and some lesions to the forearms that started a few days ago.  Reports she was recently working outside in the yard and may have been exposed to poison ivy/poison oak.  Lesions consist of red bumps that are in clusters/patches to the different areas of the body.  Rash is intensely pruritic.  Denies drainage from the rash, fever, chills, nausea, vomiting, and recent sick contacts with similar rash.  Denies recent antibiotic or steroid use.  No recent changes in personal hygiene products or intake of new foods, etc. She is using calamine lotion to the rash with some relief.    Rash   Past Medical History:  Diagnosis Date   Allergy    Breast cancer, left breast (HCC) 1986   Candida vaginitis    Diabetes mellitus without complication (HCC)    Fibroids    Hypertension    Menopausal symptoms    Pelvic relaxation    Personal history of radiation therapy 2019   Postmenopausal bleeding     Patient Active Problem List   Diagnosis Date Noted   Hypertensive disorder 08/04/2023   Lack of libido 08/04/2023   Carcinoma of breast (HCC) 08/04/2023   Malignant tumor of breast (HCC) 08/04/2023   Labile hypertension 01/11/2023   Thickening of skin of breast 01/12/2020   Genetic testing 08/06/2017   Malignant neoplasm of upper-outer quadrant of right breast in female, 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   Normal gynecologic examination 03/26/2015   Hirsutism 09/10/2014   Acne vulgaris 09/10/2014   Obesity 08/04/2011   Decreased libido 06/17/2010    Class: History of   Pelvic relaxation 04/07/2000    Class: History of   Menopausal symptom 04/16/1992     Class: History of   Breast cancer, left breast (HCC) 03/05/1986    Class: History of    Past Surgical History:  Procedure Laterality Date   BREAST LUMPECTOMY Right 07/2017   BREAST LUMPECTOMY WITH RADIOACTIVE SEED AND SENTINEL LYMPH NODE BIOPSY Right 08/18/2017   Procedure: BREAST LUMPECTOMY WITH RADIOACTIVE SEED AND DEEP AXILLARY SENTINEL LYMPH NODE BIOPSY WITH BLUE DYE INJECTION;  Surgeon: Boyce Byes, MD;  Location: [facility];  Service: General;  Laterality: Right;   MASTECTOMY  1986   left breast   REDUCTION MAMMAPLASTY Right     OB History   No obstetric history on file.      Home Medications    Prior to Admission medications   Medication Sig Start Date End Date Taking? Authorizing Provider  predniSONE (DELTASONE) 20 MG tablet Take 1 tablet (20 mg total) by mouth daily with breakfast for 5 days. 08/04/23 08/09/23 Yes StanhopeDanny Dye, FNP  benzonatate  (TESSALON ) 100 MG capsule Take 1 capsule (100 mg total) by mouth every 8 (eight) hours. 03/26/22   Raspet, Erin K, PA-C  Cholecalciferol (VITAMIN D3) 1000 units CAPS Take by mouth daily.    [provider]  Cinnamon 500 MG TABS Take by mouth.    [provider]  clindamycin (CLEOCIN) 300 MG capsule Take 300 mg by mouth 4 (four) times daily.    [provider]  hydrochlorothiazide (  HYDRODIURIL) 25 MG tablet Take 25 mg by mouth daily.    [provider]  metFORMIN (GLUCOPHAGE) 500 MG tablet  07/30/17   [provider]  multivitamin-iron-minerals-folic acid (CENTRUM) chewable tablet Chew 1 tablet by mouth daily.    [provider]  oseltamivir  (TAMIFLU ) 75 MG capsule Take 1 capsule (75 mg total) by mouth every 12 (twelve) hours. 03/26/22   Raspet, Betsey Brow, PA-C  OVER THE COUNTER MEDICATION Magnesium 250 mg daily per patient    [provider]  simvastatin (ZOCOR) 10 MG tablet  07/02/17   [provider]  triamcinolone  cream (KENALOG ) 0.1 % Apply  1 Application topically 2 (two) times daily. Apply very thin 09/15/22   Dodson Freestone, FNP  Turmeric Curcumin 500 MG CAPS Take 500 mg by mouth. 2 per day    [provider]  vitamin B-12 (CYANOCOBALAMIN) 1000 MCG tablet Take 5,000 mcg by mouth.    [provider]  zinc gluconate 50 MG tablet Take 50 mg by mouth daily.    [provider]    Family History Family History  Problem Relation Age of Onset   Cancer Mother    Breast cancer Mother    Breast cancer Brother     Social History Social History   Tobacco Use   Smoking status: Never   Smokeless tobacco: Never  Vaping Use   Vaping status: Never Used  Substance Use Topics   Alcohol use: Yes    Comment: Drinks 1 drink a week   Drug use: No     Allergies   Patient has no known allergies.   Review of Systems Review of Systems  Skin:  Positive for rash.  Per HPI   Physical Exam Triage Vital Signs ED Triage Vitals  Encounter Vitals Group     BP 08/04/23 1216 131/74     Systolic BP Percentile --      Diastolic BP Percentile --      Pulse Rate 08/04/23 1216 75     Resp 08/04/23 1216 18     Temp 08/04/23 1216 97.8 F (36.6 C)     Temp Source 08/04/23 1216 Oral     SpO2 08/04/23 1216 96 %     Weight 08/04/23 1213 181 lb 3.5 oz (82.2 kg)     Height 08/04/23 1213 5' 3.5" (1.613 m)     Head Circumference --      Peak Flow --      Pain Score 08/04/23 1212 0     Pain Loc --      Pain Education --      Exclude from Growth Chart --    No data found.  Updated Vital Signs BP 131/74 (BP Location: Right Arm)   Pulse 75   Temp 97.8 F (36.6 C) (Oral)   Resp 18   Ht 5' 3.5" (1.613 m)   Wt 181 lb 3.5 oz (82.2 kg)   SpO2 96%   BMI 31.60 kg/m   Visual Acuity Right Eye Distance:   Left Eye Distance:   Bilateral Distance:    Right Eye Near:   Left Eye Near:    Bilateral Near:     Physical Exam Vitals and nursing note reviewed.  Constitutional:      Appearance: She is not  ill-appearing or toxic-appearing.  HENT:     Head: Normocephalic and atraumatic.     Right Ear: Hearing and external ear normal.     Left Ear: Hearing and external  ear normal.     Nose: Nose normal.     Mouth/Throat:     Lips: Pink.  Eyes:     General: Lids are normal. Vision grossly intact. Gaze aligned appropriately.     Extraocular Movements: Extraocular movements intact.     Conjunctiva/sclera: Conjunctivae normal.  Pulmonary:     Effort: Pulmonary effort is normal.  Musculoskeletal:     Cervical back: Neck supple.  Skin:    General: Skin is warm and dry.     Capillary Refill: Capillary refill takes less than 2 seconds.     Findings: Rash (Clusters of maculopapular lesions to the bilateral elbows/forearms, neck, bilateral lower abdomen/beltline, and wrists.  No drainage from the lesions.  No erythema surrounding lesions.) present.  Neurological:     General: No focal deficit present.     Mental Status: She is alert and oriented to person, place, and time. Mental status is at baseline.     Cranial Nerves: No dysarthria or facial asymmetry.  Psychiatric:        Mood and Affect: Mood normal.        Speech: Speech normal.        Behavior: Behavior normal.        Thought Content: Thought content normal.        Judgment: Judgment normal.      UC Treatments / Results  Labs (all labs ordered are listed, but only abnormal results are displayed) Labs Reviewed - No data to display  EKG   Radiology No results found.  Procedures Procedures (including critical care time)  Medications Ordered in UC Medications - No data to display  Initial Impression / Assessment and Plan / UC Course  I have reviewed the triage vital signs and the nursing notes.  Pertinent labs & imaging results that were available during my care of the patient were reviewed by me and considered in my medical decision making (see chart for details).   1. Rhus dermatitis Presentation is consistent with  Rhus dermatitis. Recommend treatment with oral steroid burst and continued use of calamine lotion.  No current signs of secondary bacterial infection. She is a type II diabetic and is concerned about elevated blood sugar levels due to prednisone use.   Prednisone 20 mg once daily for 5 days ordered instead of 40 mg dose. Advised that blood sugars will increase temporarily, advised to watch diet over the next few days while taking steroid. Infection return precautions discussed.  Counseled patient on potential for adverse effects with medications prescribed/recommended today, strict ER and return-to-clinic precautions discussed, patient verbalized understanding.    Final Clinical Impressions(s) / UC Diagnoses   Final diagnoses:  Rhus dermatitis     Discharge Instructions      Take prednisone 20mg  once daily for the next 5 days.  Avoid scratching the sites. Watch for signs of infection such as redness, swelling, pus, or pain.   If you develop any new or worsening symptoms or if your symptoms do not start to improve, please return here or follow-up with your primary care provider. If your symptoms are severe, please go to the emergency room.  ED Prescriptions     Medication Sig Dispense Auth. Provider   predniSONE (DELTASONE) 20 MG tablet Take 1 tablet (20 mg total) by mouth daily with breakfast for 5 days. 5 tablet Ana Eaton, FNP      PDMP not reviewed this encounter.   Ana Coffey, [place] 08/04/23 [phone]

## 2023-12-27 ENCOUNTER — Ambulatory Visit: Admitting: Neurology

## 2023-12-27 ENCOUNTER — Encounter: Payer: Self-pay | Admitting: Neurology

## 2023-12-27 VITALS — BP 131/83 | HR 65 | Ht 63.0 in | Wt 143.5 lb

## 2023-12-27 DIAGNOSIS — G3184 Mild cognitive impairment, so stated: Secondary | ICD-10-CM

## 2023-12-27 NOTE — Patient Instructions (Addendum)
 Dementia labs including TSH, ATN, and B12  MRI Brain without contrast  Continue your other medications  I will contact you to go over the results  Return in a year   There are well-accepted and sensible ways to reduce risk for Alzheimers disease and other degenerative brain disorders .  Exercise Daily Walk A daily 20 minute walk should be part of your routine. Disease related apathy can be a significant roadblock to exercise and the only way to overcome this is to make it a daily routine and perhaps have a reward at the end (something your loved one loves to eat or drink perhaps) or a personal trainer coming to the home can also be very useful. Most importantly, the patient is much more likely to exercise if the caregiver / spouse does it with him/her. In general a structured, repetitive schedule is best.  General Health: Any diseases which effect your body will effect your brain such as a pneumonia, urinary infection, blood clot, heart attack or stroke. Keep contact with your primary care doctor for regular follow ups.  Sleep. A good nights sleep is healthy for the brain. Seven hours is recommended. If you have insomnia or poor sleep habits we can give you some instructions. If you have sleep apnea wear your mask.  Diet: Eating a heart healthy diet is also a good idea; fish and poultry instead of red meat, nuts (mostly non-peanuts), vegetables, fruits, olive oil or canola oil (instead of butter), minimal salt (use other spices to flavor foods), whole grain rice, bread, cereal and pasta and wine in moderation.Research is now showing that the MIND diet, which is a combination of The Mediterranean diet and the DASH diet, is beneficial for cognitive processing and longevity. Information about this diet can be found in The MIND Diet, a book by Annitta Feeling, MS, RDN, and online at [employer]  Finances, Power of [address] and Advance Directives: You should consider putting  legal safeguards in place with regard to financial and medical decision making. While the spouse always has power of attorney for medical and financial issues in the absence of any form, you should consider what you want in case the spouse / caregiver is no longer around or capable of making decisions.

## 2023-12-27 NOTE — Progress Notes (Signed)
 [facility]  PATIENT: Ana Coffey DOB: 04/16/1945  REQUESTING CLINICIAN: Ransom Other, MD HISTORY FROM: Patient and daughters  REASON FOR VISIT: Memory loss    HISTORICAL  CHIEF COMPLAINT:  Chief Complaint  Patient presents with   New Patient (Initial Visit)    Rm12, 2 daugters present, referral for worsening cognitive decline-needs dementia eval/Dr. Other Ransom Ee at [place] [phone]: moca score of 23    HISTORY OF PRESENT ILLNESS:  Discussed the use of AI scribe software for clinical note transcription with the patient, who gave verbal consent to proceed.  Ana Coffey is a 78 year old female with history of hypertension, hyperlipidemia, who presents with memory issues and cognitive concerns. She is accompanied by her two daughters.  She experiences memory issues, particularly with repeating herself and forgetting conversations. Her daughters have noticed that she often forgets events and conversations, sometimes within a few days. She also has difficulty remembering people's names, even those she has known for a long time.  Her daughter recounts two specific incidents that raised concern: she forgot how to operate the stove and shower at her daughter's house, despite having visited many times before. Additionally, she has shown confusion with her medications, often mixing up which ones to stop or continue, despite being reminded multiple times.  During shopping trips, she forgets recent conversations about products she uses, such as shampoo brands, within a short time frame. She also forgets recent compliments or interactions, such as with her daughter's glasses, within a week.  She has been off metformin, which her daughter believes has slightly improved her cognitive function and energy levels. She and her daughter report that she takes lisinopril, hydrochlorothiazide, simvastatin, and B12 supplements.   She lives with her husband. No  issues with daily activities such as cooking, shopping, or managing finances, although she avoids driving on highways. She maintains an active lifestyle, engaging in walking and household activities.  Her sleep is generally good, and she gets at least eight hours of sleep per night. No history of stroke, seizures, traumatic brain injury, sleep apnea, anxiety, or depression.  Family history is significant for dementia in her maternal grandmother and her brother, who was diagnosed in his mid-eighties.    TBI:   No past history of TBI Stroke:   no past history of stroke Seizures:   no past history of seizures Sleep:   no history of sleep apnea.  Mood:   patient denies anxiety and depression Family history of Dementia: Maternal Grandmother, Brother in his mid 28s   Functional status: independent in all ADLs and IADLs Patient lives with husband. Cooking: no issues Cleaning: no issues  Shopping: no issues Bathing: no issues  Toileting: no issues  Driving: no issues, denies recent accident  Bills: no issues Medications:  no issues  Ever left the stove on by accident?: denies Forget how to use items around the house?: denies Getting lost going to familiar places?: denies Forgetting loved ones names?: denies Word finding difficulty? denies Sleep: denies    OTHER MEDICAL CONDITIONS: Hypertension, Hyperlipidemia    REVIEW OF SYSTEMS: Full 14 system review of systems performed and negative with exception of: As noted in the HPI   ALLERGIES: No Known Allergies  HOME MEDICATIONS: Outpatient Medications Prior to Visit  Medication Sig Dispense Refill   Cholecalciferol (VITAMIN D3) 1000 units CAPS Take by mouth daily.     Cinnamon 500 MG TABS Take by mouth.     hydrochlorothiazide (HYDRODIURIL) 25 MG  tablet Take 25 mg by mouth daily.     lisinopril-hydrochlorothiazide (ZESTORETIC) 20-25 MG tablet Take 1 tablet by mouth daily.     metFORMIN (GLUCOPHAGE) 500 MG tablet   3    multivitamin-iron-minerals-folic acid (CENTRUM) chewable tablet Chew 1 tablet by mouth daily.     OVER THE COUNTER MEDICATION Magnesium 250 mg daily per patient     simvastatin (ZOCOR) 10 MG tablet   3   triamcinolone  cream (KENALOG ) 0.1 % Apply 1 Application topically 2 (two) times daily. Apply very thin 30 g 0   Turmeric Curcumin 500 MG CAPS Take 500 mg by mouth. 2 per day     vitamin B-12 (CYANOCOBALAMIN) 1000 MCG tablet Take 5,000 mcg by mouth.     zinc gluconate 50 MG tablet Take 50 mg by mouth daily.     benzonatate  (TESSALON ) 100 MG capsule Take 1 capsule (100 mg total) by mouth every 8 (eight) hours. 21 capsule 0   clindamycin (CLEOCIN) 300 MG capsule Take 300 mg by mouth 4 (four) times daily.     oseltamivir  (TAMIFLU ) 75 MG capsule Take 1 capsule (75 mg total) by mouth every 12 (twelve) hours. 10 capsule 0   No facility-administered medications prior to visit.    PAST MEDICAL HISTORY: Past Medical History:  Diagnosis Date   Allergy    Breast cancer, left breast (HCC) 1986   Candida vaginitis    Diabetes mellitus without complication (HCC)    Fibroids    Hypertension    Menopausal symptoms    Pelvic relaxation    Personal history of radiation therapy 2019   Postmenopausal bleeding     PAST SURGICAL HISTORY: Past Surgical History:  Procedure Laterality Date   BREAST LUMPECTOMY Right 07/2017   BREAST LUMPECTOMY WITH RADIOACTIVE SEED AND SENTINEL LYMPH NODE BIOPSY Right 08/18/2017   Procedure: BREAST LUMPECTOMY WITH RADIOACTIVE SEED AND DEEP AXILLARY SENTINEL LYMPH NODE BIOPSY WITH BLUE DYE INJECTION;  Surgeon: Gail Favorite, MD;  Location: [facility];  Service: General;  Laterality: Right;   MASTECTOMY  1986   left breast   REDUCTION MAMMAPLASTY Right     FAMILY HISTORY: Family History  Problem Relation Age of Onset   Cancer Mother    Breast cancer Mother    Dementia Brother    Breast cancer Brother    Dementia Maternal Grandmother     SOCIAL  HISTORY: Social History   Socioeconomic History   Marital status: Married    Spouse name: david Research officer, [employer]   Number of children: 2   Years of education: Not on file   Highest education level: Associate degree: academic program  Occupational History   Not on file  Tobacco Use   Smoking status: Never    Passive exposure: Never   Smokeless tobacco: Never  Vaping Use   Vaping status: Never Used  Substance and Sexual Activity   Alcohol use: Not Currently    Comment: Drinks 1 drink a week   Drug use: No   Sexual activity: Not Currently  Other Topics Concern   Not on file  Social History Narrative   Not on file   Social Drivers of Health   Financial Resource Strain: Not on file  Food Insecurity: Not on file  Transportation Needs: Not on file  Physical Activity: Not on file  Stress: Not on file  Social Connections: Not on file  Intimate Partner Violence: Not on file    PHYSICAL EXAM  GENERAL EXAM/CONSTITUTIONAL: Vitals:  Vitals:  12/27/23 0931 12/27/23 0950  BP: (!) 149/77 131/83  Pulse: 65   Weight: 143 lb 8 oz (65.1 kg)   Height: 5' 3 (1.6 m)    Body mass index is 25.42 kg/m. Wt Readings from Last 3 Encounters:  12/27/23 143 lb 8 oz (65.1 kg)  08/04/23 181 lb 3.5 oz (82.2 kg)  05/10/18 181 lb 5 oz (82.2 kg)   Patient is in no distress; well developed, nourished and groomed; neck is supple  MUSCULOSKELETAL: Gait, strength, tone, movements noted in Neurologic exam below  NEUROLOGIC: MENTAL STATUS:      No data to display            12/27/2023    9:38 AM  Montreal Cognitive Assessment   Visuospatial/ Executive (0/5) 4  Naming (0/3) 3  Attention: Read list of digits (0/2) 2  Attention: Read list of letters (0/1) 1  Attention: Serial 7 subtraction starting at 100 (0/3) 3  Language: Repeat phrase (0/2) 1  Language : Fluency (0/1) 1  Abstraction (0/2) 2  Delayed Recall (0/5) 0  Orientation (0/6) 6  Total 23    awake, alert, oriented to  person, place and time recent and remote memory intact normal attention and concentration language fluent, comprehension intact, naming intact fund of knowledge appropriate  CRANIAL NERVE:  2nd, 3rd, 4th, 6th- visual fields full to confrontation, extraocular muscles intact, no nystagmus 5th - facial sensation symmetric 7th - facial strength symmetric 8th - hearing intact 9th - palate elevates symmetrically, uvula midline 11th - shoulder shrug symmetric 12th - tongue protrusion midline  MOTOR:  normal bulk and tone, full strength in the BUE, BLE  SENSORY:  normal and symmetric to light touch  COORDINATION:  finger-nose-finger, fine finger movements normal  GAIT/STATION:  normal   DIAGNOSTIC DATA (LABS, IMAGING, TESTING) - I reviewed patient records, labs, notes, testing and imaging myself where available.  Lab Results  Component Value Date   WBC 3.5 (L) 08/04/2017   HGB 14.1 08/04/2017   HCT 42.9 08/04/2017   MCV 87.4 08/04/2017   PLT 195 08/04/2017      Component Value Date/Time   NA 141 08/04/2017 1227   K 4.0 08/04/2017 1227   CL 104 08/04/2017 1227   CO2 30 (H) 08/04/2017 1227   GLUCOSE 109 08/04/2017 1227   BUN 16 08/04/2017 1227   CREATININE 0.94 08/04/2017 1227   CALCIUM 9.6 08/04/2017 1227   PROT 7.0 08/04/2017 1227   ALBUMIN 4.1 08/04/2017 1227   AST 25 08/04/2017 1227   ALT 25 08/04/2017 1227   ALKPHOS 70 08/04/2017 1227   BILITOT 0.4 08/04/2017 1227   GFRNONAA 59 (L) 08/04/2017 1227   GFRAA >60 08/04/2017 1227   No results found for: CHOL, HDL, LDLCALC, LDLDIRECT, TRIG, CHOLHDL No results found for: YHAJ8R No results found for: VITAMINB12 No results found for: TSH    ASSESSMENT AND PLAN  78 y.o. year old female with    Mild cognitive impairment Mild cognitive impairment characterized by short-term memory loss, repetitive speech, and occasional confusion with familiar tasks with a MOCA of 23. Independent in ADLs but  requires reminders for medication management. Symptoms present for approximately 1.5 years. Family history of dementia. Differential diagnosis includes Alzheimer's disease, vascular dementia. Discussed potential progression to dementia and the role of biomarkers in predicting Alzheimer's disease. - Order blood test for Alzheimer's biomarkers, ATN profile  - Check thyroid  and B12 levels - Order MRI of the brain - Discuss treatment options including Aricept,  memantine, and new infusion therapies like lecanemab/donanemab, with associated risks and benefits - Informed consent included discussion of lecanemab/Donanemab's risks such as brain bleed and swelling, and the need for regular MRI monitoring - Encourage regular physical exercise, aiming for five days a week - Recommend cognitive exercises such as puzzles and reading - Schedule follow-up in one year to reassess cognitive status    1. Mild cognitive impairment     Patient Instructions  Dementia labs including TSH, ATN, and B12  MRI Brain without contrast  Continue your other medications  I will contact you to go over the results  Return in a year   There are well-accepted and sensible ways to reduce risk for Alzheimers disease and other degenerative brain disorders .  Exercise Daily Walk A daily 20 minute walk should be part of your routine. Disease related apathy can be a significant roadblock to exercise and the only way to overcome this is to make it a daily routine and perhaps have a reward at the end (something your loved one loves to eat or drink perhaps) or a personal trainer coming to the home can also be very useful. Most importantly, the patient is much more likely to exercise if the caregiver / spouse does it with him/her. In general a structured, repetitive schedule is best.  General Health: Any diseases which effect your body will effect your brain such as a pneumonia, urinary infection, blood clot, heart attack or stroke. Keep  contact with your primary care doctor for regular follow ups.  Sleep. A good nights sleep is healthy for the brain. Seven hours is recommended. If you have insomnia or poor sleep habits we can give you some instructions. If you have sleep apnea wear your mask.  Diet: Eating a heart healthy diet is also a good idea; fish and poultry instead of red meat, nuts (mostly non-peanuts), vegetables, fruits, olive oil or canola oil (instead of butter), minimal salt (use other spices to flavor foods), whole grain rice, bread, cereal and pasta and wine in moderation.Research is now showing that the MIND diet, which is a combination of The Mediterranean diet and the DASH diet, is beneficial for cognitive processing and longevity. Information about this diet can be found in The MIND Diet, a book by Annitta Feeling, MS, RDN, and online at [employer].es  Finances, Power of [address] and Advance Directives: You should consider putting legal safeguards in place with regard to financial and medical decision making. While the spouse always has power of attorney for medical and financial issues in the absence of any form, you should consider what you want in case the spouse / caregiver is no longer around or capable of making decisions.     Orders Placed This Encounter  Procedures   MR BRAIN WO CONTRAST   ATN PROFILE   TSH   Vitamin B12    No orders of the defined types were placed in this encounter.   Return in about 1 year (around 12/26/2024).  I have spent a total of 65 minutes dedicated to this patient today, preparing to see patient, performing a medically appropriate examination and evaluation, ordering tests and/or medications and procedures, and counseling and educating the patient/family/caregiver; independently interpreting result and communicating results to the family/patient/caregiver; and documenting clinical information in the electronic medical record.  Pastor Falling,  MD 12/27/2023, 2:01 PM  [facility] [address] [place], [place] [address] [phone]

## 2023-12-28 ENCOUNTER — Telehealth: Payer: Self-pay | Admitting: Neurology

## 2023-12-28 NOTE — Telephone Encounter (Signed)
 no auth required sent to GI [phone]

## 2023-12-30 LAB — ATN PROFILE
A -- Beta-amyloid 42/40 Ratio: 0.1 — AB (ref >0.102)
Beta-amyloid 40: 157.82 pg/mL
Beta-amyloid 42: 15.84 pg/mL
N -- NfL, Plasma: 3.65 pg/mL (ref 0.00–6.04)
T -- p-tau181: 0.78 pg/mL (ref 0.00–0.97)

## 2023-12-30 LAB — VITAMIN B12: Vitamin B-12: >2000 pg/mL — ABNORMAL HIGH (ref 232–1245)

## 2023-12-30 LAB — TSH: TSH: 1.28 u[IU]/mL (ref 0.450–4.500)

## 2023-12-31 ENCOUNTER — Ambulatory Visit: Payer: Self-pay | Admitting: Neurology

## 2023-12-31 NOTE — Progress Notes (Signed)
 Please call and advise the patient that the recent labs we checked showed the biomarkers for Alzheimer disease might be present. Please inform patient that her diagnosis is still Mild cognitive impairment, but we can say that the mild cognitive impairment might be due to Alzheimer disease. Please inform patient that we can start her with a medication called Aricept that can help with the progression of the disease. Side effects including dizziness, diarrhea or vivid dreams. Please remind patient to keep any upcoming appointments or tests and to call us  with any interim questions, concerns, problems or updates. Thanks,   Pastor Falling, MD

## 2024-01-03 NOTE — Telephone Encounter (Signed)
 Patient returned call from our office. Requesting a call back to go over lab results. If she is not available, advised that we can leave a detailed message on voicemail. She can be reached at [phone]

## 2024-04-03 ENCOUNTER — Telehealth: Payer: Self-pay | Admitting: Neurology

## 2024-04-03 ENCOUNTER — Other Ambulatory Visit: Payer: Self-pay | Admitting: Neurology

## 2024-04-03 MED ORDER — DONEPEZIL HCL 5 MG PO TABS: 5.0000 mg | ORAL_TABLET | Freq: Every day | ORAL | 6 refills | Status: AC

## 2024-04-03 NOTE — Telephone Encounter (Signed)
 Spoke with patient she would like to be started on Aricept . Pharmacy verified as [facility] [address].

## 2024-04-03 NOTE — Telephone Encounter (Signed)
 Pt called and LVM needing to speak to a someone regarding a Memory medication that MD was going to recommend. Pt is needing to know which medication was recommended. Please advise.

## 2024-04-03 NOTE — Telephone Encounter (Signed)
 Aricept  sent to pharmacy. Thanks

## 2024-12-28 ENCOUNTER — Ambulatory Visit: Admitting: Neurology
